# Patient Record
Sex: Female | Born: 1960
Health system: Southern US, Community
[De-identification: ages and names within clinical notes are randomized; demographics above are authoritative.]

## PROBLEM LIST (undated history)

## (undated) DIAGNOSIS — E663 Overweight: Secondary | ICD-10-CM

## (undated) DIAGNOSIS — R928 Other abnormal and inconclusive findings on diagnostic imaging of breast: Secondary | ICD-10-CM

## (undated) DIAGNOSIS — E782 Mixed hyperlipidemia: Secondary | ICD-10-CM

## (undated) DIAGNOSIS — D649 Anemia, unspecified: Secondary | ICD-10-CM

## (undated) DIAGNOSIS — E039 Hypothyroidism, unspecified: Secondary | ICD-10-CM

## (undated) DIAGNOSIS — T7840XA Allergy, unspecified, initial encounter: Secondary | ICD-10-CM

## (undated) DIAGNOSIS — L578 Other skin changes due to chronic exposure to nonionizing radiation: Secondary | ICD-10-CM

## (undated) DIAGNOSIS — B019 Varicella without complication: Secondary | ICD-10-CM

## (undated) DIAGNOSIS — R112 Nausea with vomiting, unspecified: Secondary | ICD-10-CM

## (undated) DIAGNOSIS — Z9889 Other specified postprocedural states: Secondary | ICD-10-CM

## (undated) DIAGNOSIS — E041 Nontoxic single thyroid nodule: Secondary | ICD-10-CM

## (undated) DIAGNOSIS — Z0000 Encounter for general adult medical examination without abnormal findings: Principal | ICD-10-CM

## (undated) HISTORY — PX: MOLE REMOVAL: SHX2046

## (undated) HISTORY — DX: Encounter for general adult medical examination without abnormal findings: Z00.00

## (undated) HISTORY — DX: Allergy, unspecified, initial encounter: T78.40XA

## (undated) HISTORY — PX: KNEE SURGERY: SHX244

## (undated) HISTORY — DX: Hypothyroidism, unspecified: E03.9

## (undated) HISTORY — DX: Nontoxic single thyroid nodule: E04.1

## (undated) HISTORY — DX: Other skin changes due to chronic exposure to nonionizing radiation: L57.8

## (undated) HISTORY — DX: Other abnormal and inconclusive findings on diagnostic imaging of breast: R92.8

## (undated) HISTORY — DX: Varicella without complication: B01.9

## (undated) HISTORY — DX: Anemia, unspecified: D64.9

## (undated) HISTORY — PX: BREAST SURGERY: SHX581

## (undated) HISTORY — DX: Mixed hyperlipidemia: E78.2

## (undated) HISTORY — DX: Overweight: E66.3

---

## 1997-04-19 ENCOUNTER — Other Ambulatory Visit: Admission: RE | Admit: 1997-04-19 | Discharge: 1997-04-19 | Payer: Self-pay | Admitting: Obstetrics and Gynecology

## 1997-04-27 ENCOUNTER — Ambulatory Visit (HOSPITAL_COMMUNITY): Admission: RE | Admit: 1997-04-27 | Discharge: 1997-04-27 | Payer: Self-pay | Admitting: Obstetrics and Gynecology

## 1998-05-09 ENCOUNTER — Other Ambulatory Visit: Admission: RE | Admit: 1998-05-09 | Discharge: 1998-05-09 | Payer: Self-pay | Admitting: Obstetrics and Gynecology

## 2000-01-22 ENCOUNTER — Other Ambulatory Visit: Admission: RE | Admit: 2000-01-22 | Discharge: 2000-01-22 | Payer: Self-pay | Admitting: Obstetrics and Gynecology

## 2002-06-18 ENCOUNTER — Encounter: Payer: Self-pay | Admitting: Endocrinology

## 2002-06-18 ENCOUNTER — Encounter: Admission: RE | Admit: 2002-06-18 | Discharge: 2002-06-18 | Payer: Self-pay | Admitting: Endocrinology

## 2004-02-29 ENCOUNTER — Other Ambulatory Visit: Admission: RE | Admit: 2004-02-29 | Discharge: 2004-02-29 | Payer: Self-pay | Admitting: Obstetrics and Gynecology

## 2007-01-02 HISTORY — PX: ENDOMETRIAL ABLATION: SHX621

## 2009-01-01 HISTORY — PX: BREAST BIOPSY: SHX20

## 2009-01-01 LAB — HM MAMMOGRAPHY

## 2009-08-05 ENCOUNTER — Encounter: Admission: RE | Admit: 2009-08-05 | Discharge: 2009-08-05 | Payer: Self-pay | Admitting: Obstetrics and Gynecology

## 2009-08-09 ENCOUNTER — Encounter: Admission: RE | Admit: 2009-08-09 | Discharge: 2009-08-09 | Payer: Self-pay | Admitting: Obstetrics and Gynecology

## 2010-04-19 ENCOUNTER — Other Ambulatory Visit: Payer: Self-pay | Admitting: Obstetrics and Gynecology

## 2010-04-19 DIAGNOSIS — Z09 Encounter for follow-up examination after completed treatment for conditions other than malignant neoplasm: Secondary | ICD-10-CM

## 2010-04-24 ENCOUNTER — Ambulatory Visit
Admission: RE | Admit: 2010-04-24 | Discharge: 2010-04-24 | Disposition: A | Discharge status: Home / Self Care | Payer: BC Managed Care – PPO | Source: Ambulatory Visit | Attending: Obstetrics and Gynecology | Admitting: Obstetrics and Gynecology

## 2010-04-24 DIAGNOSIS — Z09 Encounter for follow-up examination after completed treatment for conditions other than malignant neoplasm: Secondary | ICD-10-CM

## 2010-07-07 ENCOUNTER — Encounter (HOSPITAL_COMMUNITY): Payer: Self-pay

## 2010-07-07 ENCOUNTER — Encounter (HOSPITAL_COMMUNITY)
Admission: RE | Admit: 2010-07-07 | Discharge: 2010-07-07 | Disposition: A | Discharge status: Home / Self Care | Payer: BC Managed Care – PPO | Source: Ambulatory Visit | Attending: Obstetrics and Gynecology | Admitting: Obstetrics and Gynecology

## 2010-07-07 HISTORY — DX: Other specified postprocedural states: Z98.890

## 2010-07-07 HISTORY — DX: Nausea with vomiting, unspecified: R11.2

## 2010-07-07 HISTORY — DX: Hypothyroidism, unspecified: E03.9

## 2010-07-07 LAB — CBC
HCT: 42.4 % (ref 36.0–46.0)
MCHC: 33.7 g/dL (ref 30.0–36.0)
MCV: 96.6 fL (ref 78.0–100.0)
Platelets: 231 10*3/uL (ref 150–400)
RDW: 12.7 % (ref 11.5–15.5)
WBC: 5.7 10*3/uL (ref 4.0–10.5)

## 2010-07-07 MED ORDER — PROMETHAZINE HCL 25 MG/ML IJ SOLN: 6.2500 mg | INTRAMUSCULAR | Status: DC | PRN

## 2010-07-07 MED ORDER — ACETAMINOPHEN 10 MG/ML IV SOLN: 1000.0000 mg | Freq: Once | INTRAVENOUS | Status: DC | PRN

## 2010-07-07 MED ORDER — HYDROMORPHONE HCL 1 MG/ML IJ SOLN
0.2500 mg | INTRAMUSCULAR | Status: DC | PRN
Start: 2010-07-07 — End: 2010-07-07

## 2010-07-07 MED ORDER — KETOROLAC TROMETHAMINE 15 MG/ML IJ SOLN: 15.0000 mg | Freq: Once | INTRAMUSCULAR | Status: DC

## 2010-07-07 MED ORDER — MEPERIDINE HCL 25 MG/ML IJ SOLN: 6.2500 mg | INTRAMUSCULAR | Status: DC | PRN

## 2010-07-07 NOTE — Anesthesia Preprocedure Evaluation (Signed)
Anesthesia Evaluation  General Assessment Comment  Reviewed: Allergy & Precautions, H&P , Patient's Chart, lab work & pertinent test results and reviewed documented beta blocker date and time   History of Anesthesia Complications Negative for: history of anesthetic complications  Airway Mallampati: II TM Distance: >3 FB Neck ROM: full    Dental No notable dental hx    Pulmonaryneg pulmonary ROS      pulmonary exam normal hypothyroid  Cardiovascular Exercise Tolerance: Good regular Normal   Neuro/PsychNegative Neurological ROS Negative Psych ROS  GI/Hepatic/Renal negative GI ROS, negative Liver ROS, and negative Renal ROS (+)       Endo/Other  Negative Endocrine ROS (+)   Abdominal   Musculoskeletal  Hematology negative hematology ROS (+)   Peds  Reproductive/Obstetrics negative OB ROS          Anesthesia Physical Anesthesia Plan  ASA: II  Anesthesia Plan: General   Post-op Pain Management:    Induction:   Airway Management Planned:   Additional Equipment:   Intra-op Plan:   Post-operative Plan:   Informed Consent: I have reviewed the patients History and Physical, chart, labs and discussed the procedure including the risks, benefits and alternatives for the proposed anesthesia with the patient or authorized representative who has indicated his/her understanding and acceptance.   Dental Advisory Given  Plan Discussed with: CRNA and Surgeon  Anesthesia Plan Comments:         Anesthesia Quick Evaluation

## 2010-07-07 NOTE — Patient Instructions (Signed)
Written inst given to pt 

## 2010-07-11 ENCOUNTER — Encounter: Payer: Self-pay | Admitting: Obstetrics and Gynecology

## 2010-07-11 NOTE — H&P (Signed)
Jordan Juarez is an 50 y.o. female. She underwent EMB + Thermachoice EMA in 2009 for menorrhagia.  She improved initially, but over the last 6 12 mos. Has experienced continued problems with dysmenorrhea and menorrhagia.  She presents now for definitive LAVH.  This procedure, including risks of bleeding, transfusion, infection, adjacent organ injury, the possibility of having to complete the surgery via open technique, all reviewed, which she understands and accepts.  Pertinent Gynecological History: Menses: with severe dysmenorrhea Bleeding: menorrhagia Contraception: vasectomy DES exposure: denies Blood transfusions: none Sexually transmitted diseases: no past history Previous GYN Procedures: Thermachoice EMA  Last mammogram: normal Date: 08/2009 Last pap: normal Date: 06/2009 OB History: G3, P3   Menstrual History: Menarche age: 32 No LMP recorded.    Past Medical History  Diagnosis Date  . PONV (postoperative nausea and vomiting)   . Hypothyroidism     Past Surgical History  Procedure Date  . Endometrial ablation 2009    done in office- n&v postop    No family history on file.  Social History:  reports that she has never smoked. She does not have any smokeless tobacco history on file. She reports that she drinks about 1.2 ounces of alcohol per week. Her drug history not on file.  Allergies: No Known Allergies  No prescriptions prior to admission    Review of Systems  Constitutional: Negative.   HENT: Negative.   Eyes: Negative.   Respiratory: Negative.   Cardiovascular: Negative.   Gastrointestinal: Negative.   Genitourinary: Negative.   Musculoskeletal: Negative.   Skin: Negative.   Neurological: Negative.   Endo/Heme/Allergies: Negative.   Psychiatric/Behavioral: Negative.   All other systems reviewed and are negative.    There were no vitals taken for this visit. Physical Exam  Constitutional: She is oriented to person, place, and time. She appears  well-developed and well-nourished.  HENT:  Head: Normocephalic.  Eyes: Conjunctivae and EOM are normal. Pupils are equal, round, and reactive to light.  Neck: Normal range of motion. Neck supple.  Cardiovascular: Normal rate, regular rhythm and normal heart sounds.  Exam reveals no gallop and no friction rub.   No murmur heard. Respiratory: Effort normal and breath sounds normal. No respiratory distress. She has no wheezes. She has no rales.  GI: Soft. Bowel sounds are normal.  Genitourinary: Vagina normal.       Uterus upper limit NS, posterior  Musculoskeletal: Normal range of motion.  Neurological: She is alert and oriented to person, place, and time.    No results found for this or any previous visit (from the past 24 hour(s)).  No results found.  Assessment/Plan: 1. Menorrhagia 2.  Dysmenorrhea 3.  Prior endometrial ablation 4.  Possible Adenomyosis.   PLAN>>>LAVH , procedure + risks discussed as above.  Meriel Pica 07/11/2010, 2:53 PM

## 2010-07-11 NOTE — H&P (Signed)
Jordan Juarez is an 50 y.o. female. G3P3 with a history of prior Thermachoice EMA, now with continued heavy bleeding + dysmenorrhea, for LAVH. Procedure + specific risks related to bleeding, infection, adjacent organ injury, transfusion, the possible need for open or additional surgery, along with her expected recovery time, all of which she understands and accepts.  Pertinent Gynecological History: Mens Contraception: vasectomy DES exposure: denies Blood transfusions: none Sexually transmitted diseases: no past history Previous GYN Procedures: Thermachoice EMA  Last mammogram: normal Date: 08/2009 Last pap: normal Date: 2012 OB History: G3, P3   Menstrual History: Menarche age: 50 No LMP recorded.    Past Medical History  Diagnosis Date  . PONV (postoperative nausea and vomiting)   . Hypothyroidism     Past Surgical History  Procedure Date  . Endometrial ablation 2009    done in office- n&v postop    No family history on file.  Social History:  reports that she has never smoked. She does not have any smokeless tobacco history on file. She reports that she drinks about 1.2 ounces of alcohol per week. Her drug history not on file.  Allergies: No Known Allergies  No prescriptions prior to admission    Review of Systems  Constitutional: Negative.   HENT: Negative.   Eyes: Negative.   Respiratory: Negative.   Cardiovascular: Negative.   Gastrointestinal: Negative.   Genitourinary: Negative.   Musculoskeletal: Negative.   Skin: Negative.   Neurological: Negative.   Endo/Heme/Allergies: Negative.   Psychiatric/Behavioral: Negative.     There were no vitals taken for this visit. Physical Exam  Constitutional: She is oriented to person, place, and time. She appears well-developed and well-nourished.  HENT:  Head: Normocephalic.  Neck: Normal range of motion. Neck supple.  Cardiovascular: Normal rate, regular rhythm and normal heart sounds.   Respiratory: Effort  normal and breath sounds normal. No respiratory distress. She has no wheezes.  GI: Soft. Bowel sounds are normal.  Genitourinary: Vagina normal.       Uterus upper limit of normal size, retroflexed, adnexae NEG  Musculoskeletal: Normal range of motion.  Neurological: She is alert and oriented to person, place, and time.    No results found for this or any previous visit (from the past 24 hour(s)).  No results found.  Assessment/Plan: Menorrhagia + dysmenorrhea, failed EMA, probable adenomyosis>>>for LAVH.  [name] M 07/11/2010, 3:07 PM

## 2010-07-12 NOTE — H&P (Signed)
NAME:  Jordan Juarez, Jordan Juarez                  ACCOUNT NO.:  MEDICAL RECORD NO.:  [id number]  LOCATION:                                 FACILITY:  PHYSICIAN:  Duke Salvia. Marcelle Overlie, M.D.DATE OF BIRTH:  02/17/1960  DATE OF ADMISSION: DATE OF DISCHARGE:                             HISTORY & PHYSICAL   CHIEF COMPLAINT:  Pelvic pain, menorrhagia.  HISTORY OF PRESENT ILLNESS:  This is a 50 year old who has had a prior Thermachoice endometrial ablation and endometrial biopsy in April 2009. She had some significant improvement initially, but continued to have problematic menorrhagia and dysmenorrhea, presents now for definitive LAVH.  Last ultrasound in our office dated April 2009 showed a retroverted uterus, possible adenomyosis.  Adnexa was otherwise unremarkable.   Dictation ended at this point.     Amareon Phung M. Marcelle Overlie, M.D.     [facility]  D:  07/11/2010  T:  07/12/2010  Job:  [id number]

## 2010-07-13 ENCOUNTER — Encounter (HOSPITAL_COMMUNITY): Payer: Self-pay | Admitting: Anesthesiology

## 2010-07-13 ENCOUNTER — Encounter (HOSPITAL_COMMUNITY)
Admission: RE | Disposition: A | Discharge status: Home / Self Care | Payer: Self-pay | Source: Ambulatory Visit | Attending: Obstetrics and Gynecology

## 2010-07-13 ENCOUNTER — Ambulatory Visit (HOSPITAL_COMMUNITY)
Admission: RE | Admit: 2010-07-13 | Discharge: 2010-07-14 | Disposition: A | Discharge status: Home / Self Care | Payer: BC Managed Care – PPO | Source: Ambulatory Visit | Attending: Obstetrics and Gynecology | Admitting: Obstetrics and Gynecology

## 2010-07-13 ENCOUNTER — Other Ambulatory Visit: Payer: Self-pay | Admitting: Obstetrics and Gynecology

## 2010-07-13 ENCOUNTER — Ambulatory Visit (HOSPITAL_COMMUNITY): Payer: BC Managed Care – PPO | Admitting: Anesthesiology

## 2010-07-13 ENCOUNTER — Encounter (HOSPITAL_COMMUNITY): Payer: Self-pay | Admitting: *Deleted

## 2010-07-13 DIAGNOSIS — N920 Excessive and frequent menstruation with regular cycle: Secondary | ICD-10-CM | POA: Insufficient documentation

## 2010-07-13 DIAGNOSIS — N946 Dysmenorrhea, unspecified: Secondary | ICD-10-CM | POA: Insufficient documentation

## 2010-07-13 DIAGNOSIS — D259 Leiomyoma of uterus, unspecified: Secondary | ICD-10-CM | POA: Insufficient documentation

## 2010-07-13 DIAGNOSIS — Z01812 Encounter for preprocedural laboratory examination: Secondary | ICD-10-CM | POA: Insufficient documentation

## 2010-07-13 DIAGNOSIS — Z01818 Encounter for other preprocedural examination: Secondary | ICD-10-CM | POA: Insufficient documentation

## 2010-07-13 HISTORY — PX: ABDOMINAL HYSTERECTOMY: SHX81

## 2010-07-13 HISTORY — PX: LAPAROSCOPIC ASSISTED VAGINAL HYSTERECTOMY: SHX5398

## 2010-07-13 HISTORY — PX: TOTAL ABDOMINAL HYSTERECTOMY: SHX209

## 2010-07-13 LAB — PREGNANCY, URINE: Preg Test, Ur: NEGATIVE

## 2010-07-13 SURGERY — HYSTERECTOMY, VAGINAL, LAPAROSCOPY-ASSISTED
Anesthesia: General

## 2010-07-13 MED ORDER — FENTANYL CITRATE 0.05 MG/ML IJ SOLN
INTRAMUSCULAR | Status: AC
  Filled 2010-07-13: qty 5

## 2010-07-13 MED ORDER — ONDANSETRON HCL 4 MG/2ML IJ SOLN: 4.0000 mg | Freq: Four times a day (QID) | INTRAMUSCULAR | Status: DC | PRN

## 2010-07-13 MED ORDER — ONDANSETRON HCL 4 MG/2ML IJ SOLN
INTRAMUSCULAR | Status: AC
  Filled 2010-07-13: qty 2

## 2010-07-13 MED ORDER — SODIUM CHLORIDE 0.9 % IR SOLN
Status: DC | PRN
  Administered 2010-07-13: 1000 mL

## 2010-07-13 MED ORDER — LACTATED RINGERS IR SOLN
Status: DC | PRN
  Administered 2010-07-13: 3000 mL

## 2010-07-13 MED ORDER — DEXTROSE IN RINGERS 5 % IV SOLN
INTRAVENOUS | Status: DC
  Administered 2010-07-13 – 2010-07-14 (×2): via INTRAVENOUS
  Filled 2010-07-13 (×4): qty 1000

## 2010-07-13 MED ORDER — KETOROLAC TROMETHAMINE 30 MG/ML IJ SOLN
INTRAMUSCULAR | Status: DC | PRN
  Administered 2010-07-13: 30 mg via INTRAVENOUS

## 2010-07-13 MED ORDER — MIDAZOLAM HCL 2 MG/2ML IJ SOLN
INTRAMUSCULAR | Status: AC
  Filled 2010-07-13: qty 2

## 2010-07-13 MED ORDER — KETOROLAC TROMETHAMINE 30 MG/ML IJ SOLN: 30.0000 mg | Freq: Once | INTRAMUSCULAR | Status: DC

## 2010-07-13 MED ORDER — ROCURONIUM BROMIDE 50 MG/5ML IV SOLN
INTRAVENOUS | Status: AC
  Filled 2010-07-13: qty 1

## 2010-07-13 MED ORDER — NEOSTIGMINE METHYLSULFATE 1 MG/ML IJ SOLN
INTRAMUSCULAR | Status: AC
  Filled 2010-07-13: qty 10

## 2010-07-13 MED ORDER — IBUPROFEN 800 MG PO TABS: 800.0000 mg | ORAL_TABLET | Freq: Three times a day (TID) | ORAL | Status: DC | PRN

## 2010-07-13 MED ORDER — ONDANSETRON HCL 4 MG/2ML IJ SOLN
INTRAMUSCULAR | Status: DC | PRN
  Administered 2010-07-13: 4 mg via INTRAVENOUS

## 2010-07-13 MED ORDER — DEXAMETHASONE SODIUM PHOSPHATE 10 MG/ML IJ SOLN
INTRAMUSCULAR | Status: DC | PRN
  Administered 2010-07-13: 10 mg via INTRAVENOUS

## 2010-07-13 MED ORDER — KETOROLAC TROMETHAMINE 30 MG/ML IJ SOLN: 30.0000 mg | Freq: Four times a day (QID) | INTRAMUSCULAR | Status: DC

## 2010-07-13 MED ORDER — LACTATED RINGERS IV SOLN
500.0000 mL | INTRAVENOUS | Status: DC
  Administered 2010-07-13: 1000 mL via INTRAVENOUS
  Administered 2010-07-13 (×2): 500 mL via INTRAVENOUS

## 2010-07-13 MED ORDER — NALOXONE HCL 0.4 MG/ML IJ SOLN: 0.4000 mg | INTRAMUSCULAR | Status: DC | PRN

## 2010-07-13 MED ORDER — SCOPOLAMINE 1 MG/3DAYS TD PT72
MEDICATED_PATCH | TRANSDERMAL | Status: AC
  Administered 2010-07-13: 1.5 mg
  Filled 2010-07-13: qty 1

## 2010-07-13 MED ORDER — MORPHINE SULFATE 10 MG/ML IJ SOLN
INTRAMUSCULAR | Status: AC
  Filled 2010-07-13: qty 1

## 2010-07-13 MED ORDER — MENTHOL 3 MG MT LOZG
1.0000 | LOZENGE | OROMUCOSAL | Status: DC | PRN
  Filled 2010-07-13: qty 9

## 2010-07-13 MED ORDER — GLYCOPYRROLATE 0.2 MG/ML IJ SOLN
INTRAMUSCULAR | Status: DC | PRN
  Administered 2010-07-13: .4 mg via INTRAVENOUS
  Administered 2010-07-13: .2 mg via INTRAVENOUS

## 2010-07-13 MED ORDER — ROCURONIUM BROMIDE 100 MG/10ML IV SOLN
INTRAVENOUS | Status: DC | PRN
  Administered 2010-07-13: 50 mg via INTRAVENOUS

## 2010-07-13 MED ORDER — CEFAZOLIN SODIUM 1-5 GM-% IV SOLN
INTRAVENOUS | Status: DC | PRN
  Administered 2010-07-13: 1 g via INTRAVENOUS

## 2010-07-13 MED ORDER — DIPHENHYDRAMINE HCL 50 MG/ML IJ SOLN: 12.5000 mg | Freq: Four times a day (QID) | INTRAMUSCULAR | Status: DC | PRN

## 2010-07-13 MED ORDER — DIPHENHYDRAMINE HCL 50 MG/ML IJ SOLN
INTRAMUSCULAR | Status: AC
  Filled 2010-07-13: qty 1

## 2010-07-13 MED ORDER — DEXAMETHASONE SODIUM PHOSPHATE 10 MG/ML IJ SOLN
INTRAMUSCULAR | Status: AC
  Filled 2010-07-13: qty 1

## 2010-07-13 MED ORDER — CEFAZOLIN SODIUM 1-5 GM-% IV SOLN
INTRAVENOUS | Status: AC
  Filled 2010-07-13: qty 50

## 2010-07-13 MED ORDER — OXYCODONE-ACETAMINOPHEN 5-325 MG PO TABS: 1.0000 | ORAL_TABLET | ORAL | Status: DC | PRN

## 2010-07-13 MED ORDER — LIDOCAINE HCL (PF) 2 % IJ SOLN
INTRAMUSCULAR | Status: DC | PRN
  Administered 2010-07-13: 100 mg

## 2010-07-13 MED ORDER — PROPOFOL 10 MG/ML IV EMUL
INTRAVENOUS | Status: AC
  Filled 2010-07-13: qty 20

## 2010-07-13 MED ORDER — FENTANYL CITRATE 0.05 MG/ML IJ SOLN
INTRAMUSCULAR | Status: DC | PRN
  Administered 2010-07-13: 50 ug via INTRAVENOUS
  Administered 2010-07-13: 200 ug via INTRAVENOUS

## 2010-07-13 MED ORDER — LACTATED RINGERS IV SOLN
INTRAVENOUS | Status: DC | PRN
  Administered 2010-07-13 (×2): via INTRAVENOUS

## 2010-07-13 MED ORDER — SIMETHICONE 80 MG PO CHEW: 80.0000 mg | CHEWABLE_TABLET | Freq: Four times a day (QID) | ORAL | Status: DC | PRN

## 2010-07-13 MED ORDER — PROPOFOL 10 MG/ML IV EMUL
INTRAVENOUS | Status: DC | PRN
  Administered 2010-07-13: 200 mg via INTRAVENOUS

## 2010-07-13 MED ORDER — MORPHINE SULFATE 10 MG/ML IJ SOLN
INTRAMUSCULAR | Status: DC | PRN
  Administered 2010-07-13: 10 mg via INTRAVENOUS

## 2010-07-13 MED ORDER — ZOLPIDEM TARTRATE 5 MG PO TABS: 5.0000 mg | ORAL_TABLET | Freq: Every evening | ORAL | Status: DC | PRN

## 2010-07-13 MED ORDER — MIDAZOLAM HCL 5 MG/5ML IJ SOLN
INTRAMUSCULAR | Status: DC | PRN
  Administered 2010-07-13: 2 mg via INTRAVENOUS

## 2010-07-13 MED ORDER — NEOSTIGMINE METHYLSULFATE 1 MG/ML IJ SOLN
INTRAMUSCULAR | Status: DC | PRN
  Administered 2010-07-13: 2 mg via INTRAMUSCULAR

## 2010-07-13 MED ORDER — ONDANSETRON HCL 4 MG PO TABS: 4.0000 mg | ORAL_TABLET | Freq: Four times a day (QID) | ORAL | Status: DC | PRN

## 2010-07-13 MED ORDER — SODIUM CHLORIDE 0.9 % IJ SOLN: 9.0000 mL | INTRAMUSCULAR | Status: DC | PRN

## 2010-07-13 MED ORDER — DIPHENHYDRAMINE HCL 12.5 MG/5ML PO ELIX
12.5000 mg | ORAL_SOLUTION | Freq: Four times a day (QID) | ORAL | Status: DC | PRN
  Filled 2010-07-13: qty 5

## 2010-07-13 MED ORDER — KETOROLAC TROMETHAMINE 30 MG/ML IJ SOLN
30.0000 mg | Freq: Four times a day (QID) | INTRAMUSCULAR | Status: DC
  Administered 2010-07-13 – 2010-07-14 (×3): 30 mg via INTRAVENOUS
  Filled 2010-07-13 (×3): qty 1

## 2010-07-13 MED ORDER — SODIUM CHLORIDE 0.9 % IJ SOLN: 3.0000 mL | Freq: Two times a day (BID) | INTRAMUSCULAR | Status: DC

## 2010-07-13 MED ORDER — BUPIVACAINE HCL (PF) 0.25 % IJ SOLN
INTRAMUSCULAR | Status: DC | PRN
  Administered 2010-07-13: 10 mL

## 2010-07-13 MED ORDER — METOCLOPRAMIDE HCL 5 MG/ML IJ SOLN
10.0000 mg | Freq: Once | INTRAMUSCULAR | Status: AC
  Administered 2010-07-13: 10 mg via INTRAVENOUS

## 2010-07-13 MED ORDER — SODIUM CHLORIDE 0.9 % IV SOLN: 250.0000 mL | INTRAVENOUS | Status: DC

## 2010-07-13 MED ORDER — BISACODYL 10 MG RE SUPP
10.0000 mg | Freq: Every day | RECTAL | Status: DC | PRN
  Filled 2010-07-13: qty 1

## 2010-07-13 MED ORDER — SODIUM CHLORIDE 0.9 % IJ SOLN: 3.0000 mL | INTRAMUSCULAR | Status: DC | PRN

## 2010-07-13 MED ORDER — DIPHENHYDRAMINE HCL 50 MG/ML IJ SOLN
INTRAMUSCULAR | Status: DC | PRN
  Administered 2010-07-13: 12.5 mg via INTRAVENOUS

## 2010-07-13 MED ORDER — FENTANYL CITRATE 0.05 MG/ML IJ SOLN: 25.0000 ug | INTRAMUSCULAR | Status: DC | PRN

## 2010-07-13 MED ORDER — LIDOCAINE HCL (CARDIAC) 20 MG/ML IV SOLN
INTRAVENOUS | Status: AC
  Filled 2010-07-13: qty 5

## 2010-07-13 MED ORDER — GLYCOPYRROLATE 0.2 MG/ML IJ SOLN
INTRAMUSCULAR | Status: AC
  Filled 2010-07-13: qty 1

## 2010-07-13 MED ORDER — MORPHINE SULFATE (PF) 1 MG/ML IV SOLN
INTRAVENOUS | Status: DC
Start: 2010-07-13 — End: 2010-07-14
  Administered 2010-07-13: 25 mg via INTRAVENOUS
  Administered 2010-07-13: 7 mg via INTRAVENOUS
  Filled 2010-07-13: qty 25

## 2010-07-13 SURGICAL SUPPLY — 35 items
CABLE HIGH FREQUENCY MONO STRZ (ELECTRODE) IMPLANT
CATH ROBINSON RED A/P 16FR (CATHETERS) ×2 IMPLANT
CLOTH BEACON ORANGE TIMEOUT ST (SAFETY) ×2 IMPLANT
CONT PATH 16OZ SNAP LID 3702 (MISCELLANEOUS) ×2 IMPLANT
COVER TABLE BACK 60X90 (DRAPES) ×2 IMPLANT
DECANTER SPIKE VIAL GLASS SM (MISCELLANEOUS) ×2 IMPLANT
DERMABOND ADVANCED (GAUZE/BANDAGES/DRESSINGS) ×2 IMPLANT
DRAPE UTILITY XL STRL (DRAPES) ×2 IMPLANT
ELECT LIGASURE LONG (ELECTRODE) ×2 IMPLANT
ELECT REM PT RETURN 9FT ADLT (ELECTROSURGICAL) ×2
ELECTRODE REM PT RTRN 9FT ADLT (ELECTROSURGICAL) ×1 IMPLANT
GAUZE SPONGE 4X4 16PLY XRAY LF (GAUZE/BANDAGES/DRESSINGS) ×2 IMPLANT
GLOVE BIO SURGEON STRL SZ7 (GLOVE) ×6 IMPLANT
GLOVE BIOGEL PI IND STRL 6.5 (GLOVE) ×1 IMPLANT
GLOVE BIOGEL PI INDICATOR 6.5 (GLOVE) ×1
GOWN BRE IMP SLV AUR LG STRL (GOWN DISPOSABLE) ×8 IMPLANT
NEEDLE INSUFFLATION 14GA 120MM (NEEDLE) ×2 IMPLANT
NS IRRIG 1000ML POUR BTL (IV SOLUTION) ×2 IMPLANT
PACK LAVH (CUSTOM PROCEDURE TRAY) ×2 IMPLANT
SEALER TISSUE G2 CVD JAW 45CM (ENDOMECHANICALS) ×2 IMPLANT
SET IRRIG TUBING LAPAROSCOPIC (IRRIGATION / IRRIGATOR) ×2 IMPLANT
SOLUTION ELECTROLUBE (MISCELLANEOUS) IMPLANT
STRIP CLOSURE SKIN 1/4X3 (GAUZE/BANDAGES/DRESSINGS) IMPLANT
SUT MON AB 2-0 CT1 36 (SUTURE) ×4 IMPLANT
SUT VIC AB 0 CT1 18XCR BRD8 (SUTURE) ×2 IMPLANT
SUT VIC AB 0 CT1 8-18 (SUTURE) ×2
SUT VICRYL 0 TIES 12 18 (SUTURE) ×2 IMPLANT
SUT VICRYL 0 UR6 27IN ABS (SUTURE) ×2 IMPLANT
SUT VICRYL 4-0 PS2 18IN ABS (SUTURE) ×2 IMPLANT
TOWEL OR 17X24 6PK STRL BLUE (TOWEL DISPOSABLE) ×4 IMPLANT
TRAY FOLEY CATH 14FR (SET/KITS/TRAYS/PACK) ×2 IMPLANT
TROCAR Z-THREAD BLADED 11X100M (TROCAR) ×2 IMPLANT
TROCAR Z-THREAD BLADED 5X100MM (TROCAR) ×2 IMPLANT
WARMER LAPAROSCOPE (MISCELLANEOUS) ×2 IMPLANT
WATER STERILE IRR 1000ML POUR (IV SOLUTION) ×2 IMPLANT

## 2010-07-13 NOTE — Anesthesia Postprocedure Evaluation (Signed)
  Anesthesia Post-op Note  Patient: Jordan Juarez  Procedure(s) Performed:  LAPAROSCOPIC ASSISTED VAGINAL HYSTERECTOMY  Patient Location: PACU  Anesthesia Type: General  Level of Consciousness: awake, alert , oriented and sedated  Airway and Oxygen Therapy: Patient Spontanous Breathing  Post-op Pain: mild  Post-op Assessment: Post-op Vital signs reviewed, Patient's Cardiovascular Status Stable, Respiratory Function Stable, Patent Airway and Pain level controlled  Post-op Vital Signs: Reviewed and stable  Complications: No apparent anesthesia complications

## 2010-07-13 NOTE — Brief Op Note (Signed)
07/13/2010  8:50 AM  PATIENT:  Jordan Juarez  50 y.o. female  PRE-OPERATIVE DIAGNOSIS:  Menorrhagia,dysmenorrhea,probable adenomyosis  POST-OPERATIVE DIAGNOSIS:  same  PROCEDURE:  Procedure(s): LAPAROSCOPIC ASSISTED VAGINAL HYSTERECTOMY  SURGEON:  Surgeon(s): Doug Bucklin M Haddie Bruhl W Ronald Neal  PHYSICIAN ASSISTANT:   ASSISTANTS: Neal   ANESTHESIA:   general  ESTIMATED BLOOD LOSS: * No blood loss amount entered *   BLOOD ADMINISTERED:none  DRAINS: Urinary Catheter (Foley)   LOCAL MEDICATIONS USED:  MARCAINE 10CC  SPECIMEN:  Source of Specimen:  uterus  DISPOSITION OF SPECIMEN:  PATHOLOGY  COUNTS:  YES  TOURNIQUET:  * No tourniquets in log *  DICTATION #: see below  PLAN OF CARE: to PACU  PATIENT DISPOSITION:  PACU - hemodynamically stable.   Delay start of Pharmacological VTE agent (>24hrs) due to surgical blood loss or risk of bleeding:  no        Preop diagnosis: Menorrhagia, dysmenorrhea, probable adenomyosis, failed ThermaChoice endometrial ablation. Postop diagnosis: Same Procedure LAVH Surgeon [facility]  assistant Dr. Jennette Kettle  Specimens removed: Uterus, to pathology EBL: 200 cc Complications: None Procedure and findings:  The patient was taken to the operating room, after an adequate level of general endotracheal anesthesia was obtained with the legs in stirrups, the abdomen perineum and vagina were prepped and draped in the usual fashion for LAVH. The bladder was drained, UA carried out the uterus is upper limit of normal size, posterior in position adnexa negative. Holter tenaculum was positioned for a posterior uterus.  Attention directed to the abdomen where the subumbilical area was infiltrated with quarter percent Marcaine plain, a small incision was made the very seal was introduced without difficulty. Its intra-abdominal position was verified by pressure and water testing. After a 3 L pneumoperitoneum with syncope, laparoscopic trocar and sleeve  were introduced without difficulty. 3 finger breaths above the symphysis in the midline a 5 mm trocar was inserted under direct visualization. The patient was then placed in Trendelenburg, pelvic findings as follows:  The uterus itself was extremely retroflexed, symmetric 7 week size, adnexa negative. The cul-de-sac was free and clear. Upper abdominal findings were otherwise unremarkable.  Using the in seal device, starting on the right the utero-ovarian pedicle was coagulated and divided down to and including the round ligament with excellent hemostasis. The exact same was repeated on the opposite side, conserving both normal-appearing ovaries.  The vaginal portion of the procedure started at this point. The legs were extended, weighted speculum was positioned, cervical vaginal mucosa was incised circumferentially, posterior cul-de-sac was entered without difficulty. The bladder was advanced superiorly with sharp and blunt dissection. Using the LigaSure device the uterosacral ligament, uterine vasculature pedicles were coagulated and divided. The bladder was advanced superiorly until the anterior peritoneal reflection could be identified, this was entered sharply and a retractor used to gently elevate the bladder out of the field. The remaining upper broad ligament pedicles were coagulated and divided. The fundus of the uterus was then delivered posteriorly, remaining pedicles were clamped and divided and the specimen was removed. The vaginal cuff was then closed with 3 to 9:00 with a running locked 2-0 Monocryl suture. Prior to closure sponge needle and instrument counts were reported as correct x2. The vaginal mucosa was then closed right to left with interrupted 2-0 Monocryl sutures. Foley catheter was positioned draining clear urine.  Repeat laparoscopy with nasi irrigation revealed excellent hemostasis even at reduced pressure. Instruments were removed gas allowed to escape, the upper defect closed  with a  4-0 Monocryl subcuticular suture with Dermabond on the lower incision. She tolerated this well and went to recovery in good condition.

## 2010-07-13 NOTE — Progress Notes (Signed)
  Alert + conversant, adeq. UOP, minimal pai.  Pt prefers to have cath out this PM.

## 2010-07-13 NOTE — Transfer of Care (Signed)
Immediate Anesthesia Transfer of Care Note  Patient: Jordan Juarez  Procedure(s) Performed:  LAPAROSCOPIC ASSISTED VAGINAL HYSTERECTOMY  Patient Location: PACU  Anesthesia Type: General  Level of Consciousness: awake, alert  and oriented  Airway & Oxygen Therapy: Patient Spontanous Breathing and Patient connected to nasal cannula oxygen  Post-op Assessment: Report given to PACU RN and Post -op Vital signs reviewed and stable  Post vital signs: Reviewed and stable  Complications: No apparent anesthesia complications

## 2010-07-13 NOTE — Preoperative (Signed)
Beta Blockers   Reason not to administer Beta Blockers:Not Applicable 

## 2010-07-13 NOTE — Addendum Note (Signed)
Addendum  created 07/13/10 1000 by Tyrone Apple. [employer] edited:PRL Based Order Sets

## 2010-07-13 NOTE — Op Note (Signed)
Dictated under brief op note/[facility]

## 2010-07-14 LAB — CBC
HCT: 31.6 % — ABNORMAL LOW (ref 36.0–46.0)
Hemoglobin: 10.6 g/dL — ABNORMAL LOW (ref 12.0–15.0)
MCH: 32.4 pg (ref 26.0–34.0)
MCV: 96.6 fL (ref 78.0–100.0)
RBC: 3.27 MIL/uL — ABNORMAL LOW (ref 3.87–5.11)

## 2010-07-14 MED ORDER — OXYCODONE-ACETAMINOPHEN 5-325 MG PO TABS: 1.0000 | ORAL_TABLET | ORAL | Status: AC | PRN

## 2010-07-14 MED ORDER — IBUPROFEN 800 MG PO TABS: 800.0000 mg | ORAL_TABLET | Freq: Three times a day (TID) | ORAL | Status: AC | PRN

## 2010-07-14 MED ORDER — DEXTROSE IN LACTATED RINGERS 5 % IV SOLN: INTRAVENOUS | Status: DC

## 2010-07-14 NOTE — Discharge Summary (Signed)
  Date of admission 712 Date of discharge 713 Discharge diagnoses:   1. Menorrhagia, dysmenorrhea   2. LAVH this admission.     Hospital course:  This patient underwent LAVH on 712 under general general anesthesia. Both ovaries were normal and were conserved at that time. Her EBL was 200 cc.  On the evening of surgery her catheter was removed, she was ambulating and voiding without difficulty. The following a.m. had established established normal urinary function, was afebrile, her abdominal exam was unremarkable, tolerating a regular diet and ready for discharge at that point.  Laboratory data: Preop hemoglobin was 14, postop hemoglobin 10.6.  Disposition:  Patient is discharged on Percocet 5/325, #30, one or 2 by mouth every 6 hours when necessary pain or Motrin 800 mg, #30, one by mouth Q8 to 12 hours. Pain. She will return the office in 7-10 days. Advised to report any increased bleeding, fever over 101, dysuria, persistent nausea or vomiting. She was given specific instructions regarding diet, sex, exercise.

## 2010-08-01 ENCOUNTER — Encounter (HOSPITAL_COMMUNITY): Payer: Self-pay | Admitting: Obstetrics and Gynecology

## 2010-08-03 ENCOUNTER — Other Ambulatory Visit: Payer: Self-pay | Admitting: Obstetrics and Gynecology

## 2012-06-03 ENCOUNTER — Ambulatory Visit (INDEPENDENT_AMBULATORY_CARE_PROVIDER_SITE_OTHER): Payer: 59 | Admitting: Family Medicine

## 2012-06-03 ENCOUNTER — Telehealth: Payer: Self-pay | Admitting: Family Medicine

## 2012-06-03 ENCOUNTER — Encounter: Payer: Self-pay | Admitting: Family Medicine

## 2012-06-03 VITALS — BP 100/68 | HR 69 | Temp 98.3°F | Ht 63.5 in | Wt 162.1 lb

## 2012-06-03 DIAGNOSIS — Z0000 Encounter for general adult medical examination without abnormal findings: Secondary | ICD-10-CM

## 2012-06-03 DIAGNOSIS — R928 Other abnormal and inconclusive findings on diagnostic imaging of breast: Secondary | ICD-10-CM

## 2012-06-03 DIAGNOSIS — E041 Nontoxic single thyroid nodule: Secondary | ICD-10-CM

## 2012-06-03 DIAGNOSIS — T7840XA Allergy, unspecified, initial encounter: Secondary | ICD-10-CM

## 2012-06-03 DIAGNOSIS — D649 Anemia, unspecified: Secondary | ICD-10-CM

## 2012-06-03 DIAGNOSIS — L578 Other skin changes due to chronic exposure to nonionizing radiation: Secondary | ICD-10-CM

## 2012-06-03 DIAGNOSIS — E039 Hypothyroidism, unspecified: Secondary | ICD-10-CM

## 2012-06-03 HISTORY — DX: Nontoxic single thyroid nodule: E04.1

## 2012-06-03 HISTORY — DX: Anemia, unspecified: D64.9

## 2012-06-03 HISTORY — DX: Other skin changes due to chronic exposure to nonionizing radiation: L57.8

## 2012-06-03 HISTORY — DX: Hypothyroidism, unspecified: E03.9

## 2012-06-03 HISTORY — DX: Other abnormal and inconclusive findings on diagnostic imaging of breast: R92.8

## 2012-06-03 HISTORY — DX: Encounter for general adult medical examination without abnormal findings: Z00.00

## 2012-06-03 HISTORY — DX: Allergy, unspecified, initial encounter: T78.40XA

## 2012-06-03 NOTE — Telephone Encounter (Signed)
Labs in 4 weeks fasting, lipid, renal, cbc, tsh, hepatic   Patient will be going to the [facility] lab

## 2012-06-03 NOTE — Telephone Encounter (Signed)
Orders placed.

## 2012-06-03 NOTE — Patient Instructions (Addendum)
Labs in 4 weeks fasting, lipid, renal, cbc, tsh, hepatic  Preventive Care for Adults, Female A healthy lifestyle and preventive care can promote health and wellness. Preventive health guidelines for women include the following key practices.  A routine yearly physical is a good way to check with your caregiver about your health and preventive screening. It is a chance to share any concerns and updates on your health, and to receive a thorough exam.  Visit your dentist for a routine exam and preventive care every 6 months. Brush your teeth twice a day and floss once a day. Good oral hygiene prevents tooth decay and gum disease.  The frequency of eye exams is based on your age, health, family medical history, use of contact lenses, and other factors. Follow your caregiver's recommendations for frequency of eye exams.  Eat a healthy diet. Foods like vegetables, fruits, whole grains, low-fat dairy products, and lean protein foods contain the nutrients you need without too many calories. Decrease your intake of foods high in solid fats, added sugars, and salt. Eat the right amount of calories for you.Get information about a proper diet from your caregiver, if necessary.  Regular physical exercise is one of the most important things you can do for your health. Most adults should get at least 150 minutes of moderate-intensity exercise (any activity that increases your heart rate and causes you to sweat) each week. In addition, most adults need muscle-strengthening exercises on 2 or more days a week.  Maintain a healthy weight. The body mass index (BMI) is a screening tool to identify possible weight problems. It provides an estimate of body fat based on height and weight. Your caregiver can help determine your BMI, and can help you achieve or maintain a healthy weight.For adults 20 years and older:  A BMI below 18.5 is considered underweight.  A BMI of 18.5 to 24.9 is normal.  A BMI of 25 to 29.9 is  considered overweight.  A BMI of 30 and above is considered obese.  Maintain normal blood lipids and cholesterol levels by exercising and minimizing your intake of saturated fat. Eat a balanced diet with plenty of fruit and vegetables. Blood tests for lipids and cholesterol should begin at age 41 and be repeated every 5 years. If your lipid or cholesterol levels are high, you are over 50, or you are at high risk for heart disease, you may need your cholesterol levels checked more frequently.Ongoing high lipid and cholesterol levels should be treated with medicines if diet and exercise are not effective.  If you smoke, find out from your caregiver how to quit. If you do not use tobacco, do not start.  If you are pregnant, do not drink alcohol. If you are breastfeeding, be very cautious about drinking alcohol. If you are not pregnant and choose to drink alcohol, do not exceed 1 drink per day. One drink is considered to be 12 ounces (355 mL) of beer, 5 ounces (148 mL) of wine, or 1.5 ounces (44 mL) of liquor.  Avoid use of street drugs. Do not share needles with anyone. Ask for help if you need support or instructions about stopping the use of drugs.  High blood pressure causes heart disease and increases the risk of stroke. Your blood pressure should be checked at least every 1 to 2 years. Ongoing high blood pressure should be treated with medicines if weight loss and exercise are not effective.  If you are 53 to 52 years old, ask your  caregiver if you should take aspirin to prevent strokes.  Diabetes screening involves taking a blood sample to check your fasting blood sugar level. This should be done once every 3 years, after age 11, if you are within normal weight and without risk factors for diabetes. Testing should be considered at a younger age or be carried out more frequently if you are overweight and have at least 1 risk factor for diabetes.  Breast cancer screening is essential preventive  care for women. You should practice "breast self-awareness." This means understanding the normal appearance and feel of your breasts and may include breast self-examination. Any changes detected, no matter how small, should be reported to a caregiver. Women in their 71s and 30s should have a clinical breast exam (CBE) by a caregiver as part of a regular health exam every 1 to 3 years. After age 40, women should have a CBE every year. Starting at age 56, women should consider having a mammography (breast X-ray test) every year. Women who have a family history of breast cancer should talk to their caregiver about genetic screening. Women at a high risk of breast cancer should talk to their caregivers about having magnetic resonance imaging (MRI) and a mammography every year.  The Pap test is a screening test for cervical cancer. A Pap test can show cell changes on the cervix that might become cervical cancer if left untreated. A Pap test is a procedure in which cells are obtained and examined from the lower end of the uterus (cervix).  Women should have a Pap test starting at age 82.  Between ages 87 and 27, Pap tests should be repeated every 2 years.  Beginning at age 22, you should have a Pap test every 3 years as long as the past 3 Pap tests have been normal.  Some women have medical problems that increase the chance of getting cervical cancer. Talk to your caregiver about these problems. It is especially important to talk to your caregiver if a new problem develops soon after your last Pap test. In these cases, your caregiver may recommend more frequent screening and Pap tests.  The above recommendations are the same for women who have or have not gotten the vaccine for human papillomavirus (HPV).  If you had a hysterectomy for a problem that was not cancer or a condition that could lead to cancer, then you no longer need Pap tests. Even if you no longer need a Pap test, a regular exam is a good idea  to make sure no other problems are starting.  If you are between ages 65 and 34, and you have had normal Pap tests going back 10 years, you no longer need Pap tests. Even if you no longer need a Pap test, a regular exam is a good idea to make sure no other problems are starting.  If you have had past treatment for cervical cancer or a condition that could lead to cancer, you need Pap tests and screening for cancer for at least 20 years after your treatment.  If Pap tests have been discontinued, risk factors (such as a new sexual partner) need to be reassessed to determine if screening should be resumed.  The HPV test is an additional test that may be used for cervical cancer screening. The HPV test looks for the virus that can cause the cell changes on the cervix. The cells collected during the Pap test can be tested for HPV. The HPV test could be  used to screen women aged 24 years and older, and should be used in women of any age who have unclear Pap test results. After the age of 60, women should have HPV testing at the same frequency as a Pap test.  Colorectal cancer can be detected and often prevented. Most routine colorectal cancer screening begins at the age of 4 and continues through age 55. However, your caregiver may recommend screening at an earlier age if you have risk factors for colon cancer. On a yearly basis, your caregiver may provide home test kits to check for hidden blood in the stool. Use of a small camera at the end of a tube, to directly examine the colon (sigmoidoscopy or colonoscopy), can detect the earliest forms of colorectal cancer. Talk to your caregiver about this at age 40, when routine screening begins. Direct examination of the colon should be repeated every 5 to 10 years through age 33, unless early forms of pre-cancerous polyps or small growths are found.  Hepatitis C blood testing is recommended for all people born from [birth year] through 1965 and any individual with known  risks for hepatitis C.  Practice safe sex. Use condoms and avoid high-risk sexual practices to reduce the spread of sexually transmitted infections (STIs). STIs include gonorrhea, chlamydia, syphilis, trichomonas, herpes, HPV, and human immunodeficiency virus (HIV). Herpes, HIV, and HPV are viral illnesses that have no cure. They can result in disability, cancer, and death. Sexually active women aged 43 and younger should be checked for chlamydia. Older women with new or multiple partners should also be tested for chlamydia. Testing for other STIs is recommended if you are sexually active and at increased risk.  Osteoporosis is a disease in which the bones lose minerals and strength with aging. This can result in serious bone fractures. The risk of osteoporosis can be identified using a bone density scan. Women ages 36 and over and women at risk for fractures or osteoporosis should discuss screening with their caregivers. Ask your caregiver whether you should take a calcium supplement or vitamin D to reduce the rate of osteoporosis.  Menopause can be associated with physical symptoms and risks. Hormone replacement therapy is available to decrease symptoms and risks. You should talk to your caregiver about whether hormone replacement therapy is right for you.  Use sunscreen with sun protection factor (SPF) of 30 or more. Apply sunscreen liberally and repeatedly throughout the day. You should seek shade when your shadow is shorter than you. Protect yourself by wearing long sleeves, pants, a wide-brimmed hat, and sunglasses year round, whenever you are outdoors.  Once a month, do a whole body skin exam, using a mirror to look at the skin on your back. Notify your caregiver of new moles, moles that have irregular borders, moles that are larger than a pencil eraser, or moles that have changed in shape or color.  Stay current with required immunizations.  Influenza. You need a dose every fall (or winter).  The composition of the flu vaccine changes each year, so being vaccinated once is not enough.  Pneumococcal polysaccharide. You need 1 to 2 doses if you smoke cigarettes or if you have certain chronic medical conditions. You need 1 dose at age 85 (or older) if you have never been vaccinated.  Tetanus, diphtheria, pertussis (Tdap, Td). Get 1 dose of Tdap vaccine if you are younger than age 61, are over 26 and have contact with an infant, are a Research scientist (physical sciences), are pregnant, or simply want to  be protected from whooping cough. After that, you need a Td booster dose every 10 years. Consult your caregiver if you have not had at least 3 tetanus and diphtheria-containing shots sometime in your life or have a deep or dirty wound.  HPV. You need this vaccine if you are a woman age 82 or younger. The vaccine is given in 3 doses over 6 months.  Measles, mumps, rubella (MMR). You need at least 1 dose of MMR if you were born in 1957 or later. You may also need a second dose.  Meningococcal. If you are age 27 to 85 and a first-year college student living in a residence hall, or have one of several medical conditions, you need to get vaccinated against meningococcal disease. You may also need additional booster doses.  Zoster (shingles). If you are age 37 or older, you should get this vaccine.  Varicella (chickenpox). If you have never had chickenpox or you were vaccinated but received only 1 dose, talk to your caregiver to find out if you need this vaccine.  Hepatitis A. You need this vaccine if you have a specific risk factor for hepatitis A virus infection or you simply wish to be protected from this disease. The vaccine is usually given as 2 doses, 6 to 18 months apart.  Hepatitis B. You need this vaccine if you have a specific risk factor for hepatitis B virus infection or you simply wish to be protected from this disease. The vaccine is given in 3 doses, usually over 6 months. Preventive Services /  Frequency Ages 72 to 53  Blood pressure check.** / Every 1 to 2 years.  Lipid and cholesterol check.** / Every 5 years beginning at age 70.  Clinical breast exam.** / Every 3 years for women in their 58s and 30s.  Pap test.** / Every 2 years from ages 17 through 66. Every 3 years starting at age 41 through age 31 or 36 with a history of 3 consecutive normal Pap tests.  HPV screening.** / Every 3 years from ages 78 through ages 45 to 13 with a history of 3 consecutive normal Pap tests.  Hepatitis C blood test.** / For any individual with known risks for hepatitis C.  Skin self-exam. / Monthly.  Influenza immunization.** / Every year.  Pneumococcal polysaccharide immunization.** / 1 to 2 doses if you smoke cigarettes or if you have certain chronic medical conditions.  Tetanus, diphtheria, pertussis (Tdap, Td) immunization. / A one-time dose of Tdap vaccine. After that, you need a Td booster dose every 10 years.  HPV immunization. / 3 doses over 6 months, if you are 21 and younger.  Measles, mumps, rubella (MMR) immunization. / You need at least 1 dose of MMR if you were born in 1957 or later. You may also need a second dose.  Meningococcal immunization. / 1 dose if you are age 33 to 87 and a first-year college student living in a residence hall, or have one of several medical conditions, you need to get vaccinated against meningococcal disease. You may also need additional booster doses.  Varicella immunization.** / Consult your caregiver.  Hepatitis A immunization.** / Consult your caregiver. 2 doses, 6 to 18 months apart.  Hepatitis B immunization.** / Consult your caregiver. 3 doses usually over 6 months. Ages 81 to 41  Blood pressure check.** / Every 1 to 2 years.  Lipid and cholesterol check.** / Every 5 years beginning at age 71.  Clinical breast exam.** / Every year after  age 84.  Mammogram.** / Every year beginning at age 29 and continuing for as long as you are in  good health. Consult with your caregiver.  Pap test.** / Every 3 years starting at age 80 through age 88 or 62 with a history of 3 consecutive normal Pap tests.  HPV screening.** / Every 3 years from ages 88 through ages 78 to 25 with a history of 3 consecutive normal Pap tests.  Fecal occult blood test (FOBT) of stool. / Every year beginning at age 48 and continuing until age 58. You may not need to do this test if you get a colonoscopy every 10 years.  Flexible sigmoidoscopy or colonoscopy.** / Every 5 years for a flexible sigmoidoscopy or every 10 years for a colonoscopy beginning at age 16 and continuing until age 38.  Hepatitis C blood test.** / For all people born from [birth year] through 1965 and any individual with known risks for hepatitis C.  Skin self-exam. / Monthly.  Influenza immunization.** / Every year.  Pneumococcal polysaccharide immunization.** / 1 to 2 doses if you smoke cigarettes or if you have certain chronic medical conditions.  Tetanus, diphtheria, pertussis (Tdap, Td) immunization.** / A one-time dose of Tdap vaccine. After that, you need a Td booster dose every 10 years.  Measles, mumps, rubella (MMR) immunization. / You need at least 1 dose of MMR if you were born in 1957 or later. You may also need a second dose.  Varicella immunization.** / Consult your caregiver.  Meningococcal immunization.** / Consult your caregiver.  Hepatitis A immunization.** / Consult your caregiver. 2 doses, 6 to 18 months apart.  Hepatitis B immunization.** / Consult your caregiver. 3 doses, usually over 6 months. Ages 28 and over  Blood pressure check.** / Every 1 to 2 years.  Lipid and cholesterol check.** / Every 5 years beginning at age 21.  Clinical breast exam.** / Every year after age 66.  Mammogram.** / Every year beginning at age 71 and continuing for as long as you are in good health. Consult with your caregiver.  Pap test.** / Every 3 years starting at age 72 through  age 79 or 63 with a 3 consecutive normal Pap tests. Testing can be stopped between 65 and 70 with 3 consecutive normal Pap tests and no abnormal Pap or HPV tests in the past 10 years.  HPV screening.** / Every 3 years from ages 25 through ages 36 or 98 with a history of 3 consecutive normal Pap tests. Testing can be stopped between 65 and 70 with 3 consecutive normal Pap tests and no abnormal Pap or HPV tests in the past 10 years.  Fecal occult blood test (FOBT) of stool. / Every year beginning at age 34 and continuing until age 41. You may not need to do this test if you get a colonoscopy every 10 years.  Flexible sigmoidoscopy or colonoscopy.** / Every 5 years for a flexible sigmoidoscopy or every 10 years for a colonoscopy beginning at age 36 and continuing until age 60.  Hepatitis C blood test.** / For all people born from [birth year] through 1965 and any individual with known risks for hepatitis C.  Osteoporosis screening.** / A one-time screening for women ages 71 and over and women at risk for fractures or osteoporosis.  Skin self-exam. / Monthly.  Influenza immunization.** / Every year.  Pneumococcal polysaccharide immunization.** / 1 dose at age 54 (or older) if you have never been vaccinated.  Tetanus, diphtheria, pertussis (Tdap, Td) immunization. /  A one-time dose of Tdap vaccine if you are over 65 and have contact with an infant, are a Research scientist (physical sciences), or simply want to be protected from whooping cough. After that, you need a Td booster dose every 10 years.  Varicella immunization.** / Consult your caregiver.  Meningococcal immunization.** / Consult your caregiver.  Hepatitis A immunization.** / Consult your caregiver. 2 doses, 6 to 18 months apart.  Hepatitis B immunization.** / Check with your caregiver. 3 doses, usually over 6 months. ** Family history and personal history of risk and conditions may change your caregiver's recommendations. Document Released: 02/13/2001 Document  Revised: 03/12/2011 Document Reviewed: 05/15/2010 [facility] Patient Information 2014 [place], [place].

## 2012-06-03 NOTE — Progress Notes (Signed)
Patient ID: Jordan Juarez, female   DOB: 1960-07-31, 52 y.o.   MRN: [id number] Geoffrey Mankin [id number] 1960-11-06 06/03/2012      Progress Note-Follow Up  Subjective  Chief Complaint  Chief Complaint  Patient presents with  . Establish Care    new patient    HPI  Patient is a 52 year old Caucasian female in today for new patient appointment. She is in need of thyroid disease. She reports otherwise generally feeling well. No recent illness. No complaints of headache, chest pain palpitations, shortness of, GI or GU concerns at this time. Has had breast biopsies in the past but always were benign. She is a nodular thyroid which has been stable For many years.  Past Medical History  Diagnosis Date  . PONV (postoperative nausea and vomiting)   . Hypothyroidism   . Chicken pox as a child  . Unspecified hypothyroidism 06/03/2012  . Allergic state 06/03/2012  . Preventative health care 06/03/2012  . Thyroid nodule 06/03/2012  . Sun-damaged skin 06/03/2012    Past Surgical History  Procedure Laterality Date  . Endometrial ablation  2009    done in office- n&v postop  . Laparoscopic assisted vaginal hysterectomy  07/13/2010    Procedure: LAPAROSCOPIC ASSISTED VAGINAL HYSTERECTOMY;  Surgeon: Meriel Pica;  Location: [facility];  Service: Gynecology;  Laterality: N/A;  . Abdominal hysterectomy  07-13-10    still has ovaries  . Mole removal Right     forehead, 4th grade  . Breast surgery      left breast biopsies    Family History  Problem Relation Age of Onset  . Heart attack Mother   . Emphysema Mother     smoker  . Thyroid disease Mother   . Hypertension Father   . Leukemia Sister     acute/ smoker  . Thyroid disease Sister   . Depression Sister     serious MVA, chronic pain  . Cancer Maternal Grandmother     lung/ smoker  . Diabetes Maternal Grandmother     type 2  . Stroke Maternal Grandfather     several  . Hypertension Maternal Grandfather   . Cancer Paternal  Grandfather     bone/ smoker  . Cancer Daughter     2 abnormal skin lesion excisions at age 4 first time  . Asthma Son     History   Social History  . Marital Status: Married    Spouse Name: N/A    Number of Children: N/A  . Years of Education: N/A   Occupational History  . Not on file.   Social History Main Topics  . Smoking status: Never Smoker   . Smokeless tobacco: Not on file  . Alcohol Use: 1.2 oz/week    2 Glasses of wine per week  . Drug Use: No  . Sexually Active: Yes   Other Topics Concern  . Not on file   Social History Narrative  . No narrative on file    Current Outpatient Prescriptions on File Prior to Visit  Medication Sig Dispense Refill  . levothyroxine (SYNTHROID, LEVOTHROID) 137 MCG tablet Take 137 mcg by mouth daily.         No current facility-administered medications on file prior to visit.    No Known Allergies  Review of Systems  Review of Systems  Constitutional: Negative for fever, chills and malaise/fatigue.  HENT: Negative for hearing loss, nosebleeds and congestion.   Eyes: Negative for discharge.  Respiratory: Negative for cough, sputum production,  shortness of breath and wheezing.   Cardiovascular: Negative for chest pain, palpitations and leg swelling.  Gastrointestinal: Negative for heartburn, nausea, vomiting, abdominal pain, diarrhea, constipation and blood in stool.  Genitourinary: Negative for dysuria, urgency, frequency and hematuria.  Musculoskeletal: Negative for myalgias, back pain and falls.  Skin: Negative for rash.  Neurological: Negative for dizziness, tremors, sensory change, focal weakness, loss of consciousness, weakness and headaches.  Endo/Heme/Allergies: Negative for polydipsia. Does not bruise/bleed easily.  Psychiatric/Behavioral: Negative for depression and suicidal ideas. The patient is not nervous/anxious and does not have insomnia.     Objective  BP 100/68  Pulse 69  Temp(Src) 98.3 F (36.8 C)  (Oral)  Ht 5' 3.5" (1.613 m)  Wt 162 lb 1.3 oz (73.519 kg)  BMI 28.26 kg/m2  SpO2 98%  LMP 07/05/2010  Physical Exam  Physical Exam  Constitutional: She is oriented to person, place, and time and well-developed, well-nourished, and in no distress. No distress.  HENT:  Head: Normocephalic and atraumatic.  Right Ear: External ear normal.  Left Ear: External ear normal.  Nose: Nose normal.  Mouth/Throat: Oropharynx is clear and moist. No oropharyngeal exudate.  Eyes: Conjunctivae are normal. Pupils are equal, round, and reactive to light. Right eye exhibits no discharge. Left eye exhibits no discharge. No scleral icterus.  Neck: Normal range of motion. Neck supple. No thyromegaly present.  Cardiovascular: Normal rate, regular rhythm, normal heart sounds and intact distal pulses.   No murmur heard. Pulmonary/Chest: Effort normal and breath sounds normal. No respiratory distress. She has no wheezes. She has no rales.  Abdominal: Soft. Bowel sounds are normal. She exhibits no distension and no mass. There is no tenderness.  Musculoskeletal: Normal range of motion. She exhibits no edema and no tenderness.  Lymphadenopathy:    She has no cervical adenopathy.  Neurological: She is alert and oriented to person, place, and time. She has normal reflexes. No cranial nerve deficit. Coordination normal.  Skin: Skin is warm and dry. No rash noted. She is not diaphoretic.  Psychiatric: Mood, memory and affect normal.    No results found for this basename: TSH   Lab Results  Component Value Date   WBC 6.9 07/14/2010   HGB 10.6* 07/14/2010   HCT 31.6* 07/14/2010   MCV 96.6 07/14/2010   PLT 139* 07/14/2010     Assessment & Plan  Unspecified hypothyroidism Allowed refill on Levothyroxine while we await lab results.  Anemia Historical, last noted on 2010. Will recheck cbc with labs  Sun-damaged skin Encouraged minimize sun exposure and routine dermatology exam  Preventative health  care Referred for colonoscopy, receives gyn care with Dr Marcelle Overlie. Encouraged DASH diet and increased exercise.   Thyroid nodule No concerns on exam consider follow up Ultrasound at later visit.

## 2012-06-04 ENCOUNTER — Other Ambulatory Visit: Payer: Self-pay | Admitting: Obstetrics and Gynecology

## 2012-06-04 ENCOUNTER — Encounter: Payer: Self-pay | Admitting: Internal Medicine

## 2012-06-04 DIAGNOSIS — N63 Unspecified lump in unspecified breast: Secondary | ICD-10-CM

## 2012-06-08 NOTE — Assessment & Plan Note (Signed)
Referred for colonoscopy, receives gyn care with Dr Marcelle Overlie. Encouraged DASH diet and increased exercise.

## 2012-06-08 NOTE — Assessment & Plan Note (Signed)
Encouraged minimize sun exposure and routine dermatology exam

## 2012-06-08 NOTE — Assessment & Plan Note (Signed)
No concerns on exam consider follow up Ultrasound at later visit.

## 2012-06-08 NOTE — Assessment & Plan Note (Signed)
Historical, last noted on 2010. Will recheck cbc with labs

## 2012-06-08 NOTE — Assessment & Plan Note (Signed)
Allowed refill on Levothyroxine while we await lab results.

## 2012-06-18 ENCOUNTER — Other Ambulatory Visit: Payer: 59

## 2012-06-30 ENCOUNTER — Ambulatory Visit
Admission: RE | Admit: 2012-06-30 | Discharge: 2012-06-30 | Disposition: A | Discharge status: Home / Self Care | Payer: 59 | Source: Ambulatory Visit | Attending: Obstetrics and Gynecology | Admitting: Obstetrics and Gynecology

## 2012-06-30 DIAGNOSIS — N63 Unspecified lump in unspecified breast: Secondary | ICD-10-CM

## 2012-07-23 ENCOUNTER — Ambulatory Visit (AMBULATORY_SURGERY_CENTER): Payer: 59 | Admitting: *Deleted

## 2012-07-23 VITALS — Ht 63.5 in | Wt 157.2 lb

## 2012-07-23 DIAGNOSIS — Z1211 Encounter for screening for malignant neoplasm of colon: Secondary | ICD-10-CM

## 2012-07-23 MED ORDER — MOVIPREP 100 G PO SOLR: 1.0000 | Freq: Once | ORAL | Status: DC

## 2012-07-23 NOTE — Progress Notes (Signed)
No egg or soy allergy. ewm No home 02 use. ewm No problems with past sedation except post op nausea/vomiting. ewm No previous colon. ewm emmi video to pts email. ewm

## 2012-08-06 ENCOUNTER — Ambulatory Visit (AMBULATORY_SURGERY_CENTER): Payer: 59 | Admitting: Internal Medicine

## 2012-08-06 ENCOUNTER — Encounter: Payer: Self-pay | Admitting: Internal Medicine

## 2012-08-06 VITALS — BP 97/59 | HR 59 | Temp 97.6°F | Resp 22 | Ht 63.5 in | Wt 157.0 lb

## 2012-08-06 DIAGNOSIS — Z1211 Encounter for screening for malignant neoplasm of colon: Secondary | ICD-10-CM

## 2012-08-06 MED ORDER — SODIUM CHLORIDE 0.9 % IV SOLN: 500.0000 mL | INTRAVENOUS | Status: DC

## 2012-08-06 NOTE — Progress Notes (Signed)
NO EGG OR SOY ALLERGY. EMW 

## 2012-08-06 NOTE — Progress Notes (Signed)
Lidocaine-40mg IV prior to Propofol InductionPropofol given over incremental dosages 

## 2012-08-06 NOTE — Patient Instructions (Addendum)
YOU HAD AN ENDOSCOPIC PROCEDURE TODAY AT THE [facility]: Refer to the procedure report that was given to you for any specific questions about what was found during the examination.  If the procedure report does not answer your questions, please call your gastroenterologist to clarify.  If you requested that your care partner not be given the details of your procedure findings, then the procedure report has been included in a sealed envelope for you to review at your convenience later.  YOU SHOULD EXPECT: Some feelings of bloating in the abdomen. Passage of more gas than usual.  Walking can help get rid of the air that was put into your GI tract during the procedure and reduce the bloating. If you had a lower endoscopy (such as a colonoscopy or flexible sigmoidoscopy) you may notice spotting of blood in your stool or on the toilet paper. If you underwent a bowel prep for your procedure, then you may not have a normal bowel movement for a few days.  DIET: Your first meal following the procedure should be a light meal and then it is ok to progress to your normal diet.  A half-sandwich or bowl of soup is an example of a good first meal.  Heavy or fried foods are harder to digest and may make you feel nauseous or bloated.  Likewise meals heavy in dairy and vegetables can cause extra gas to form and this can also increase the bloating.  Drink plenty of fluids but you should avoid alcoholic beverages for 24 hours.  ACTIVITY: Your care partner should take you home directly after the procedure.  You should plan to take it easy, moving slowly for the rest of the day.  You can resume normal activity the day after the procedure however you should NOT DRIVE or use heavy machinery for 24 hours (because of the sedation medicines used during the test).    SYMPTOMS TO REPORT IMMEDIATELY: A gastroenterologist can be reached at any hour.  During normal business hours, 8:30 AM to 5:00 PM Monday through Friday,  call [phone].  After hours and on weekends, please call the GI answering service at [phone] who will take a message and have the physician on call contact you.   Following lower endoscopy (colonoscopy or flexible sigmoidoscopy):  Excessive amounts of blood in the stool  Significant tenderness or worsening of abdominal pains  Swelling of the abdomen that is new, acute  Fever of 100F or higher    FOLLOW UP: If any biopsies were taken you will be contacted by phone or by letter within the next 1-3 weeks.  Call your gastroenterologist if you have not heard about the biopsies in 3 weeks.  Our staff will call the home number listed on your records the next business day following your procedure to check on you and address any questions or concerns that you may have at that time regarding the information given to you following your procedure. This is a courtesy call and so if there is no answer at the home number and we have not heard from you through the emergency physician on call, we will assume that you have returned to your regular daily activities without incident.  SIGNATURES/CONFIDENTIALITY: You and/or your care partner have signed paperwork which will be entered into your electronic medical record.  These signatures attest to the fact that that the information above on your After Visit Summary has been reviewed and is understood.  Full responsibility of the confidentiality   of this discharge information lies with you and/or your care-partner.  Normal colonoscopy.  Repeat in 10 years-2024  Hemorrhoid information given.

## 2012-08-06 NOTE — Op Note (Signed)
[facility] [address] [place], [address]   COLONOSCOPY PROCEDURE REPORT  PATIENT: Jordan Juarez, Jordan Juarez  MR#: [id number] BIRTHDATE: Apr 04, 1960 , 52  yrs. old GENDER: Female ENDOSCOPIST: Beverley Fiedler, MD REFERRED XB:JYNWG Abner Greenspan, M.D. PROCEDURE DATE:  08/06/2012 PROCEDURE:   Colonoscopy, screening First Screening Colonoscopy - Avg.  risk and is 50 yrs.  old or older Yes.  Prior Negative Screening - Now for repeat screening. N/A  History of Adenoma - Now for follow-up colonoscopy & has been > or = to 3 yrs.  N/A  Polyps Removed Today? No.  Recommend repeat exam, <10 yrs? No. ASA CLASS:   Class II INDICATIONS:average risk screening and first colonoscopy. MEDICATIONS: MAC sedation, administered by CRNA and propofol (Diprivan) 300mg  IV  DESCRIPTION OF PROCEDURE:   After the risks benefits and alternatives of the procedure were thoroughly explained, informed consent was obtained.  A digital rectal exam revealed no rectal mass.   The LB [id number]-[id number]  endoscope was introduced through the anus and advanced to the terminal ileum which was intubated for a short distance. No adverse events experienced.   The quality of the prep was good, using MoviPrep  The instrument was then slowly withdrawn as the colon was fully examined.   COLON FINDINGS: The mucosa appeared normal in the terminal ileum. A normal appearing cecum, ileocecal valve, and appendiceal orifice were identified.  The ascending, hepatic flexure, transverse, splenic flexure, descending, sigmoid colon and rectum appeared unremarkable.  No polyps or cancers were seen.  Retroflexed views revealed small external hemorrhoids and skin tag. The time to cecum=4 minutes 15 seconds.  Withdrawal time=11 minutes 00 seconds. The scope was withdrawn and the procedure completed. COMPLICATIONS: There were no complications.  ENDOSCOPIC IMPRESSION: 1.   Normal mucosa in the terminal ileum 2.   Normal colon 3.   Small  external hemorrhoids  RECOMMENDATIONS: You should continue to follow colorectal cancer screening guidelines for "routine risk" patients with a repeat colonoscopy in 10 years. There is no need for FOBT (stool) testing for at least 5 years.   eSigned:  Beverley Fiedler, MD 08/06/2012 11:23 AM   cc: The Patient and Reuel Derby, MD

## 2012-08-06 NOTE — Progress Notes (Signed)
Patient did not experience any of the following events: a burn prior to discharge; a fall within the facility; wrong site/side/patient/procedure/implant event; or a hospital transfer or hospital admission upon discharge from the facility. (G8907) Patient did not have preoperative order for IV antibiotic SSI prophylaxis. (G8918)  

## 2012-08-08 ENCOUNTER — Telehealth: Payer: Self-pay | Admitting: *Deleted

## 2012-08-08 NOTE — Telephone Encounter (Signed)
  Follow up Call-  Call back number 08/06/2012  Post procedure Call Back phone  # [phone]  Permission to leave phone message Yes     Patient questions:  Do you have a fever, pain , or abdominal swelling? no Pain Score  0 *  Have you tolerated food without any problems? yes  Have you been able to return to your normal activities? yes  Do you have any questions about your discharge instructions: Diet   no Medications  no Follow up visit  no  Do you have questions or concerns about your Care? no  Actions: * If pain score is 4 or above: No action needed, pain <4.

## 2013-01-19 ENCOUNTER — Encounter: Payer: Self-pay | Admitting: Family Medicine

## 2013-01-19 ENCOUNTER — Telehealth: Payer: Self-pay

## 2013-01-19 DIAGNOSIS — Z0000 Encounter for general adult medical examination without abnormal findings: Secondary | ICD-10-CM

## 2013-01-19 DIAGNOSIS — E039 Hypothyroidism, unspecified: Secondary | ICD-10-CM

## 2013-01-19 DIAGNOSIS — D649 Anemia, unspecified: Secondary | ICD-10-CM

## 2013-01-19 LAB — CBC WITH DIFFERENTIAL/PLATELET
BASOS PCT: 1 % (ref 0–1)
Basophils Absolute: 0 10*3/uL (ref 0.0–0.1)
EOS ABS: 0.2 10*3/uL (ref 0.0–0.7)
Eosinophils Relative: 5 % (ref 0–5)
HCT: 40.6 % (ref 36.0–46.0)
HEMOGLOBIN: 13.7 g/dL (ref 12.0–15.0)
Lymphocytes Relative: 46 % (ref 12–46)
Lymphs Abs: 1.7 10*3/uL (ref 0.7–4.0)
MCH: 31.2 pg (ref 26.0–34.0)
MCHC: 33.7 g/dL (ref 30.0–36.0)
MCV: 92.5 fL (ref 78.0–100.0)
MONO ABS: 0.4 10*3/uL (ref 0.1–1.0)
MONOS PCT: 10 % (ref 3–12)
NEUTROS PCT: 38 % — AB (ref 43–77)
Neutro Abs: 1.4 10*3/uL — ABNORMAL LOW (ref 1.7–7.7)
Platelets: 228 10*3/uL (ref 150–400)
RBC: 4.39 MIL/uL (ref 3.87–5.11)
RDW: 13.8 % (ref 11.5–15.5)
WBC: 3.6 10*3/uL — ABNORMAL LOW (ref 4.0–10.5)

## 2013-01-19 NOTE — Telephone Encounter (Signed)
Lab order placed.

## 2013-01-20 LAB — HEPATIC FUNCTION PANEL
ALBUMIN: 4.3 g/dL (ref 3.5–5.2)
ALT: 14 U/L (ref 0–35)
AST: 18 U/L (ref 0–37)
Alkaline Phosphatase: 64 U/L (ref 39–117)
BILIRUBIN TOTAL: 0.8 mg/dL (ref 0.3–1.2)
Bilirubin, Direct: 0.2 mg/dL (ref 0.0–0.3)
Indirect Bilirubin: 0.6 mg/dL (ref 0.0–0.9)
Total Protein: 6.6 g/dL (ref 6.0–8.3)

## 2013-01-20 LAB — RENAL FUNCTION PANEL
Albumin: 4.3 g/dL (ref 3.5–5.2)
BUN: 13 mg/dL (ref 6–23)
CALCIUM: 9.6 mg/dL (ref 8.4–10.5)
CO2: 28 mEq/L (ref 19–32)
CREATININE: 0.69 mg/dL (ref 0.50–1.10)
Chloride: 103 mEq/L (ref 96–112)
GLUCOSE: 91 mg/dL (ref 70–99)
POTASSIUM: 4.3 meq/L (ref 3.5–5.3)
Phosphorus: 3.3 mg/dL (ref 2.3–4.6)
Sodium: 140 mEq/L (ref 135–145)

## 2013-01-20 LAB — TSH: TSH: 0.067 u[IU]/mL — ABNORMAL LOW (ref 0.350–4.500)

## 2013-01-20 LAB — LIPID PANEL
CHOLESTEROL: 167 mg/dL (ref 0–200)
HDL: 64 mg/dL (ref >39)
LDL Cholesterol: 94 mg/dL (ref 0–99)
Total CHOL/HDL Ratio: 2.6 Ratio
Triglycerides: 43 mg/dL (ref <150)
VLDL: 9 mg/dL (ref 0–40)

## 2013-01-27 ENCOUNTER — Telehealth: Payer: Self-pay

## 2013-01-27 DIAGNOSIS — E039 Hypothyroidism, unspecified: Secondary | ICD-10-CM

## 2013-01-27 MED ORDER — LEVOTHYROXINE SODIUM 125 MCG PO TABS: 125.0000 ug | ORAL_TABLET | Freq: Every day | ORAL | Status: DC

## 2013-01-27 NOTE — Telephone Encounter (Signed)
Lab orders and new rx sent

## 2013-11-04 ENCOUNTER — Other Ambulatory Visit: Payer: Self-pay | Admitting: Family Medicine

## 2013-11-04 NOTE — Telephone Encounter (Signed)
30 day supply of levothyroxine sent to pt's pharmacy. Per last lab note, pt is past due for TSH. Please call pt to arrange.

## 2014-01-14 ENCOUNTER — Telehealth: Payer: Self-pay | Admitting: Family Medicine

## 2014-01-14 NOTE — Telephone Encounter (Signed)
Caller name: Sorter, Chaunice Relation to pt: self  Call back number:[phone] Pharmacy: [id number] - [place], [place] - [address] [phone] (Phone)     Reason for call:  Pt requesting a refill levothyroxine (SYNTHROID, LEVOTHROID) 125 MCG tablet to hold her over until next appointment which is 01/25/14

## 2014-01-15 MED ORDER — LEVOTHYROXINE SODIUM 125 MCG PO TABS: ORAL_TABLET | ORAL | Status: DC

## 2014-01-15 NOTE — Telephone Encounter (Signed)
On 01/20/13 patient was advised to have a repeat TSH in 12 weeks due to abnormal lab, She had not been rechecked. She has a pending apt on 01/25/14. Please advise if it is ok to fill.     KP

## 2014-01-15 NOTE — Telephone Encounter (Signed)
She can have 1 month supply but no refills til seen

## 2014-01-15 NOTE — Telephone Encounter (Signed)
patient aware Rx has been sent.      KP

## 2014-01-25 ENCOUNTER — Telehealth: Payer: Self-pay | Admitting: *Deleted

## 2014-01-25 ENCOUNTER — Ambulatory Visit: Payer: 59 | Admitting: Family Medicine

## 2014-01-25 NOTE — Telephone Encounter (Signed)
No show fee's will not be charged today due to weather

## 2014-01-25 NOTE — Telephone Encounter (Signed)
Pt did not show for appointment 01/25/14 at 8:00am for med check.  Charge no show fee?

## 2014-02-16 ENCOUNTER — Ambulatory Visit (INDEPENDENT_AMBULATORY_CARE_PROVIDER_SITE_OTHER): Payer: 59 | Admitting: Family Medicine

## 2014-02-16 ENCOUNTER — Encounter: Payer: Self-pay | Admitting: Family Medicine

## 2014-02-16 VITALS — BP 112/76 | HR 73 | Temp 98.0°F | Ht 64.0 in | Wt 159.2 lb

## 2014-02-16 DIAGNOSIS — T7840XD Allergy, unspecified, subsequent encounter: Secondary | ICD-10-CM

## 2014-02-16 DIAGNOSIS — E039 Hypothyroidism, unspecified: Secondary | ICD-10-CM

## 2014-02-16 MED ORDER — LEVOTHYROXINE SODIUM 125 MCG PO TABS: ORAL_TABLET | ORAL | Status: DC

## 2014-02-16 NOTE — Patient Instructions (Signed)
Hypothyroidism The thyroid is a large gland located in the lower front of your neck. The thyroid gland helps control metabolism. Metabolism is how your body handles food. It controls metabolism with the hormone thyroxine. When this gland is underactive (hypothyroid), it produces too little hormone.  CAUSES These include:   Absence or destruction of thyroid tissue.  Goiter due to iodine deficiency.  Goiter due to medications.  Congenital defects (since birth).  Problems with the pituitary. This causes a lack of TSH (thyroid stimulating hormone). This hormone tells the thyroid to turn out more hormone. SYMPTOMS  Lethargy (feeling as though you have no energy)  Cold intolerance  Weight gain (in spite of normal food intake)  Dry skin  Coarse hair  Menstrual irregularity (if severe, may lead to infertility)  Slowing of thought processes Cardiac problems are also caused by insufficient amounts of thyroid hormone. Hypothyroidism in the newborn is cretinism, and is an extreme form. It is important that this form be treated adequately and immediately or it will lead rapidly to retarded physical and mental development. DIAGNOSIS  To prove hypothyroidism, your caregiver may do blood tests and ultrasound tests. Sometimes the signs are hidden. It may be necessary for your caregiver to watch this illness with blood tests either before or after diagnosis and treatment. TREATMENT  Low levels of thyroid hormone are increased by using synthetic thyroid hormone. This is a safe, effective treatment. It usually takes about four weeks to gain the full effects of the medication. After you have the full effect of the medication, it will generally take another four weeks for problems to leave. Your caregiver may start you on low doses. If you have had heart problems the dose may be gradually increased. It is generally not an emergency to get rapidly to normal. HOME CARE INSTRUCTIONS   Take your  medications as your caregiver suggests. Let your caregiver know of any medications you are taking or start taking. Your caregiver will help you with dosage schedules.  As your condition improves, your dosage needs may increase. It will be necessary to have continuing blood tests as suggested by your caregiver.  Report all suspected medication side effects to your caregiver. SEEK MEDICAL CARE IF: Seek medical care if you develop:  Sweating.  Tremulousness (tremors).  Anxiety.  Rapid weight loss.  Heat intolerance.  Emotional swings.  Diarrhea.  Weakness. SEEK IMMEDIATE MEDICAL CARE IF:  You develop chest pain, an irregular heart beat (palpitations), or a rapid heart beat. MAKE SURE YOU:   Understand these instructions.  Will watch your condition.  Will get help right away if you are not doing well or get worse. Document Released: 12/18/2004 Document Revised: 03/12/2011 Document Reviewed: 08/08/2007 ExitCare Patient Information 2015 [employer]. This information is not intended to replace advice given to you by your health care provider. Make sure you discuss any questions you have with your health care provider.  

## 2014-02-16 NOTE — Progress Notes (Signed)
Pre visit review using our clinic review tool, if applicable. No additional management support is needed unless otherwise documented below in the visit note. 

## 2014-02-17 ENCOUNTER — Other Ambulatory Visit: Payer: 59

## 2014-02-17 ENCOUNTER — Other Ambulatory Visit (INDEPENDENT_AMBULATORY_CARE_PROVIDER_SITE_OTHER): Payer: 59

## 2014-02-17 DIAGNOSIS — E039 Hypothyroidism, unspecified: Secondary | ICD-10-CM

## 2014-02-17 LAB — CBC
HCT: 43.3 % (ref 36.0–46.0)
Hemoglobin: 14.4 g/dL (ref 12.0–15.0)
MCHC: 33.4 g/dL (ref 30.0–36.0)
MCV: 95.7 fl (ref 78.0–100.0)
Platelets: 237 10*3/uL (ref 150.0–400.0)
RBC: 4.52 Mil/uL (ref 3.87–5.11)
RDW: 14.3 % (ref 11.5–15.5)
WBC: 4.7 10*3/uL (ref 4.0–10.5)

## 2014-02-17 LAB — COMPREHENSIVE METABOLIC PANEL
ALT: 14 U/L (ref 0–35)
AST: 21 U/L (ref 0–37)
Albumin: 4.5 g/dL (ref 3.5–5.2)
Alkaline Phosphatase: 68 U/L (ref 39–117)
BUN: 13 mg/dL (ref 6–23)
CO2: 30 meq/L (ref 19–32)
Calcium: 9.9 mg/dL (ref 8.4–10.5)
Chloride: 101 mEq/L (ref 96–112)
Creatinine, Ser: 0.86 mg/dL (ref 0.40–1.20)
GFR: 73.47 mL/min (ref >60.00)
GLUCOSE: 90 mg/dL (ref 70–99)
POTASSIUM: 4 meq/L (ref 3.5–5.1)
SODIUM: 139 meq/L (ref 135–145)
TOTAL PROTEIN: 7.3 g/dL (ref 6.0–8.3)
Total Bilirubin: 0.7 mg/dL (ref 0.2–1.2)

## 2014-02-17 LAB — T4, FREE: Free T4: 1.23 ng/dL (ref 0.60–1.60)

## 2014-02-17 LAB — TSH: TSH: 8.2 u[IU]/mL — AB (ref 0.35–4.50)

## 2014-02-18 ENCOUNTER — Other Ambulatory Visit: Payer: Self-pay | Admitting: Family Medicine

## 2014-02-18 MED ORDER — LEVOTHYROXINE SODIUM 125 MCG PO TABS: ORAL_TABLET | ORAL | Status: DC

## 2014-03-01 NOTE — Progress Notes (Signed)
Jordan Juarez  [id number] 1960/03/29 03/01/2014      Progress Note-Follow Up  Subjective  Chief Complaint  Chief Complaint  Patient presents with  . Medication Refill    HPI  Patient is a 54 y.o. female in today for routine medical care. Patient is in today for lab work and follow-up. Feeling well. No recent illness or acute complaints. Denies CP/palp/SOB/HA/congestion/fevers/GI or GU c/o. Taking meds as prescribed  Past Medical History  Diagnosis Date  . PONV (postoperative nausea and vomiting)   . Hypothyroidism   . Chicken pox as a child  . Unspecified hypothyroidism 06/03/2012  . Allergic state 06/03/2012  . Preventative health care 06/03/2012  . Thyroid nodule 06/03/2012  . Sun-damaged skin 06/03/2012  . Anemia 06/03/2012  . Breast lesion on mammography 06/03/2012    S/p biopsy on left with marker left behind, benign    Past Surgical History  Procedure Laterality Date  . Endometrial ablation  2009    done in office- n&v postop  . Laparoscopic assisted vaginal hysterectomy  07/13/2010    Procedure: LAPAROSCOPIC ASSISTED VAGINAL HYSTERECTOMY;  Surgeon: Margarette Asal;  Location: [facility];  Service: Gynecology;  Laterality: N/A;  . Abdominal hysterectomy  07-13-10    still has ovaries  . Mole removal Right     forehead, 4th grade  . Breast surgery      left breast biopsies    Family History  Problem Relation Age of Onset  . Heart attack Mother   . Emphysema Mother     smoker  . Thyroid disease Mother   . Hypertension Father   . Leukemia Sister     acute/ smoker  . Thyroid disease Sister   . Depression Sister     serious MVA, chronic pain  . Cancer Maternal Grandmother     lung/ smoker  . Diabetes Maternal Grandmother     type 2  . Stroke Maternal Grandfather     several  . Hypertension Maternal Grandfather   . Cancer Paternal Grandfather     bone/ smoker  . Cancer Daughter     2 abnormal skin lesion excisions at age 66 first time  . Asthma Son   . Colon  cancer Neg Hx   . Rectal cancer Neg Hx   . Stomach cancer Neg Hx     History   Social History  . Marital Status: Married    Spouse Name: N/A  . Number of Children: N/A  . Years of Education: N/A   Occupational History  . Not on file.   Social History Main Topics  . Smoking status: Never Smoker   . Smokeless tobacco: Never Used  . Alcohol Use: 1.2 oz/week    2 Glasses of wine per week  . Drug Use: No  . Sexual Activity: Yes   Other Topics Concern  . Not on file   Social History Narrative    Current Outpatient Prescriptions on File Prior to Visit  Medication Sig Dispense Refill  . acetaminophen (TYLENOL) 500 MG tablet Take 500 mg by mouth every 6 (six) hours as needed for pain.    . cetirizine (ZYRTEC) 10 MG tablet Take 10 mg by mouth daily.    Marland Kitchen ibuprofen (ADVIL) 200 MG tablet Take 200 mg by mouth every 6 (six) hours as needed for pain.     No current facility-administered medications on file prior to visit.    No Known Allergies  Review of Systems  Review of Systems  Constitutional:  Negative for fever and malaise/fatigue.  HENT: Negative for congestion and nosebleeds.   Respiratory: Negative for sputum production and shortness of breath.   Cardiovascular: Negative for chest pain, palpitations and leg swelling.  Gastrointestinal: Negative for heartburn, nausea, vomiting, abdominal pain, diarrhea and constipation.  Genitourinary: Negative for dysuria and frequency.  Musculoskeletal: Negative for myalgias and back pain.  Skin: Negative for rash.  Neurological: Negative for dizziness, loss of consciousness, weakness and headaches.  Endo/Heme/Allergies: Negative for polydipsia.  Psychiatric/Behavioral: Negative for depression. The patient is not nervous/anxious and does not have insomnia.     Objective  BP 112/76 mmHg  Pulse 73  Temp(Src) 98 F (36.7 C) (Oral)  Ht 5\' 4"  (1.626 m)  Wt 159 lb 4 oz (72.235 kg)  BMI 27.32 kg/m2  SpO2 93%  LMP  07/05/2010  Physical Exam  Physical Exam  Constitutional: She is oriented to person, place, and time and well-developed, well-nourished, and in no distress. No distress.  HENT:  Head: Normocephalic and atraumatic.  Right Ear: External ear normal.  Left Ear: External ear normal.  Nose: Nose normal.  Mouth/Throat: Oropharynx is clear and moist. No oropharyngeal exudate.  Eyes: Conjunctivae are normal. Pupils are equal, round, and reactive to light. Right eye exhibits no discharge. Left eye exhibits no discharge. No scleral icterus.  Neck: Normal range of motion. Neck supple. No thyromegaly present.  Cardiovascular: Normal rate, regular rhythm, normal heart sounds and intact distal pulses.   No murmur heard. Pulmonary/Chest: Effort normal and breath sounds normal. No respiratory distress. She has no wheezes. She has no rales.  Abdominal: Soft. Bowel sounds are normal. She exhibits no distension and no mass. There is no tenderness.  Musculoskeletal: Normal range of motion. She exhibits no edema or tenderness.  Lymphadenopathy:    She has no cervical adenopathy.  Neurological: She is alert and oriented to person, place, and time. She has normal reflexes. No cranial nerve deficit. Coordination normal.  Skin: Skin is warm and dry. No rash noted. She is not diaphoretic.  Psychiatric: Mood, memory and affect normal.    Lab Results  Component Value Date   TSH 8.20* 02/17/2014   Lab Results  Component Value Date   WBC 4.7 02/17/2014   HGB 14.4 02/17/2014   HCT 43.3 02/17/2014   MCV 95.7 02/17/2014   PLT 237.0 02/17/2014   Lab Results  Component Value Date   CREATININE 0.86 02/17/2014   BUN 13 02/17/2014   NA 139 02/17/2014   K 4.0 02/17/2014   CL 101 02/17/2014   CO2 30 02/17/2014   Lab Results  Component Value Date   ALT 14 02/17/2014   AST 21 02/17/2014   ALKPHOS 68 02/17/2014   BILITOT 0.7 02/17/2014   Lab Results  Component Value Date   CHOL 167 01/19/2013   Lab  Results  Component Value Date   HDL 64 01/19/2013   Lab Results  Component Value Date   LDLCALC 94 01/19/2013   Lab Results  Component Value Date   TRIG 43 01/19/2013   Lab Results  Component Value Date   CHOLHDL 2.6 01/19/2013     Assessment & Plan  Hypothyroidism TSH elevated and free T4 normal. Will maintain same dose of Levothyroxine but will increase to 2 tabs just on Saturday.    Allergic state No recent flares.

## 2014-03-01 NOTE — Assessment & Plan Note (Signed)
No recent flares 

## 2014-03-01 NOTE — Assessment & Plan Note (Signed)
TSH elevated and free T4 normal. Will maintain same dose of Levothyroxine but will increase to 2 tabs just on Saturday.

## 2014-03-12 ENCOUNTER — Other Ambulatory Visit: Payer: Self-pay

## 2014-03-12 DIAGNOSIS — Z1231 Encounter for screening mammogram for malignant neoplasm of breast: Secondary | ICD-10-CM

## 2014-03-15 ENCOUNTER — Ambulatory Visit
Admission: RE | Admit: 2014-03-15 | Discharge: 2014-03-15 | Disposition: A | Discharge status: Home / Self Care | Payer: 59 | Source: Ambulatory Visit

## 2014-03-15 DIAGNOSIS — Z1231 Encounter for screening mammogram for malignant neoplasm of breast: Secondary | ICD-10-CM

## 2014-06-11 ENCOUNTER — Ambulatory Visit (INDEPENDENT_AMBULATORY_CARE_PROVIDER_SITE_OTHER)
Admission: RE | Admit: 2014-06-11 | Discharge: 2014-06-11 | Disposition: A | Discharge status: Home / Self Care | Payer: 59 | Source: Ambulatory Visit | Attending: Family Medicine | Admitting: Family Medicine

## 2014-06-11 ENCOUNTER — Encounter: Payer: Self-pay | Admitting: Family Medicine

## 2014-06-11 ENCOUNTER — Ambulatory Visit (INDEPENDENT_AMBULATORY_CARE_PROVIDER_SITE_OTHER): Payer: 59 | Admitting: Family Medicine

## 2014-06-11 VITALS — BP 116/60 | HR 70 | Temp 97.8°F | Wt 158.2 lb

## 2014-06-11 DIAGNOSIS — M25572 Pain in left ankle and joints of left foot: Secondary | ICD-10-CM

## 2014-06-11 DIAGNOSIS — S93402A Sprain of unspecified ligament of left ankle, initial encounter: Secondary | ICD-10-CM | POA: Diagnosis not present

## 2014-06-11 NOTE — Progress Notes (Signed)
Dr. Frederico Hamman T. Latrish Mogel, MD, [facility] [place], [address] Phone: [phone] Fax: [phone]  06/11/2014  Patient: Jordan Juarez, MRN: [id number], DOB: Apr 28, 1960, 54 y.o.  Primary Physician:  Penni Homans, MD  Chief Complaint: Ankle Pain  Subjective:   Jordan Juarez is a 54 y.o. very pleasant female patient who presents with the following:  DOI 06/09/2014  Dr. Elease Hashimoto called me and asked me to see Jordan Juarez for a left inversion ankle injury.  She was hiking a couple of days ago, and sustained an inversion injury and subsequently lost her balance and fell to the side.  She struck the lateral aspect of her fibula when she fell at the same time.  Immediately, she was able to ambulate and had to walk one to 2 miles off of the Trail to get off of the Trail.  She has been able to ambulate throughout this time.  She does have pain in the fibula distally.  She does have some significant swelling and no significant bruising thus far.  Thus far, she has been elevating it and putting some ice on it.  Past Medical History, Surgical History, Social History, Family History, Problem List, Medications, and Allergies have been reviewed and updated if relevant.  Patient Active Problem List   Diagnosis Date Noted  . Hypothyroidism 06/03/2012  . Allergic state 06/03/2012  . Preventative health care 06/03/2012  . Thyroid nodule 06/03/2012  . Sun-damaged skin 06/03/2012  . Anemia 06/03/2012  . Breast lesion on mammography 06/03/2012    Past Medical History  Diagnosis Date  . PONV (postoperative nausea and vomiting)   . Hypothyroidism   . Chicken pox as a child  . Unspecified hypothyroidism 06/03/2012  . Allergic state 06/03/2012  . Preventative health care 06/03/2012  . Thyroid nodule 06/03/2012  . Sun-damaged skin 06/03/2012  . Anemia 06/03/2012  . Breast lesion on mammography 06/03/2012    S/p biopsy on left with marker left  behind, benign    Past Surgical History  Procedure Laterality Date  . Endometrial ablation  2009    done in office- n&v postop  . Laparoscopic assisted vaginal hysterectomy  07/13/2010    Procedure: LAPAROSCOPIC ASSISTED VAGINAL HYSTERECTOMY;  Surgeon: Margarette Asal;  Location: [facility];  Service: Gynecology;  Laterality: N/A;  . Abdominal hysterectomy  07-13-10    still has ovaries  . Mole removal Right     forehead, 4th grade  . Breast surgery      left breast biopsies    History   Social History  . Marital Status: Married    Spouse Name: N/A  . Number of Children: N/A  . Years of Education: N/A   Occupational History  . Not on file.   Social History Main Topics  . Smoking status: Never Smoker   . Smokeless tobacco: Never Used  . Alcohol Use: 1.2 oz/week    2 Glasses of wine per week  . Drug Use: No  . Sexual Activity: Yes   Other Topics Concern  . Not on file   Social History Narrative    Family History  Problem Relation Age of Onset  . Heart attack Mother   . Emphysema Mother     smoker  . Thyroid disease Mother   . Hypertension Father   . Leukemia Sister     acute/ smoker  . Thyroid disease Sister   . Depression Sister     serious MVA,  chronic pain  . Cancer Maternal Grandmother     lung/ smoker  . Diabetes Maternal Grandmother     type 2  . Stroke Maternal Grandfather     several  . Hypertension Maternal Grandfather   . Cancer Paternal Grandfather     bone/ smoker  . Cancer Daughter     2 abnormal skin lesion excisions at age 14 first time  . Asthma Son   . Colon cancer Neg Hx   . Rectal cancer Neg Hx   . Stomach cancer Neg Hx     No Known Allergies  Medication list reviewed and updated in full in [facility].  GEN: No fevers, chills. Nontoxic. Primarily MSK c/o today. MSK: Detailed in the HPI GI: tolerating PO intake without difficulty Neuro: No numbness, parasthesias, or tingling associated. Otherwise the pertinent positives  of the ROS are noted above.   Objective:   BP 116/60 mmHg  Pulse 70  Temp(Src) 97.8 F (36.6 C) (Oral)  Wt 158 lb 4 oz (71.782 kg)  SpO2 97%  LMP 07/05/2010   GEN: WDWN, NAD, Non-toxic, Alert & Oriented x 3 HEENT: Atraumatic, Normocephalic.  Ears and Nose: No external deformity. EXTR: No clubbing/cyanosis/edema NEURO: Normal gait. Mild antalgia. PSYCH: Normally interactive. Conversant. Not depressed or anxious appearing.  Calm demeanor.    Left foot and ankle: All toes are nontender.  The entirety of the forefoot is nontender.  The entirety of the midfoot is nontender.  Including navicular, cuboid's, cuneiforms, and the base of the fifth metatarsal.  Nontender along the Achilles tendon.  The calcaneus is nontender.  Squeeze testing is nontender.  Medial malleolus is nontender.  Lateral malleolus is tender to palpation and tender to palpation at the distal fibula.  Nontender at the deltoid ligament.  The ATFL and CFL are both tender to palpation.  Anterior drawer testing causes pain.  Radiology: Dg Ankle Complete Left  06/11/2014   CLINICAL DATA:  Pain.  Initial evaluation.  EXAM: LEFT ANKLE COMPLETE - 3+ VIEW  COMPARISON:  None.  FINDINGS: Soft tissue swelling noted over the lateral malleolus. No evidence of fracture or dislocation.  IMPRESSION: Mild soft tissue swelling.  No acute bony or joint abnormality.   Electronically Signed   By: Marcello Moores  Register   On: 06/11/2014 14:09    The radiological images were independently reviewed by myself in the office and results were reviewed with the patient. My independent interpretation of images:  There is no evidence for occult fracture or dislocation.  The mortise is preserved.  Grossly normal ankle series. Owens Loffler, MD   Assessment and Plan:   Left ankle pain - Plan: DG Ankle Complete Left  Left ankle sprain, initial encounter  ATFL and CFL sprain, grade 1. Bone contusion, distal fibula.  She should do well.  Upcoming  mission trip, recommended elevation and icing for the next few days, and NSAIDs.  Begin patient range of motion and rehabilitation program now. Given [facility] ankle program.  I gave her an ASO ankle brace to use, particularly when she is on her mission trip and walking.  Follow-up: prn  Orders Placed This Encounter  Procedures  . DG Ankle Complete Left    Signed,  Dmya Long T. Laryah Neuser, MD   Patient's Medications  New Prescriptions   No medications on file  Previous Medications   ACETAMINOPHEN (TYLENOL) 500 MG TABLET    Take 500 mg by mouth every 6 (six) hours as needed for pain.   CETIRIZINE (ZYRTEC)  10 MG TABLET    Take 10 mg by mouth daily.   IBUPROFEN (ADVIL) 200 MG TABLET    Take 200 mg by mouth every 6 (six) hours as needed for pain.   LEVOTHYROXINE (SYNTHROID, LEVOTHROID) 125 MCG TABLET    Take 1 by mouth daily except on saturdays take 2 by mouth  Modified Medications   No medications on file  Discontinued Medications   No medications on file

## 2014-06-11 NOTE — Progress Notes (Signed)
Pre visit review using our clinic review tool, if applicable. No additional management support is needed unless otherwise documented below in the visit note. 

## 2014-07-12 ENCOUNTER — Encounter: Payer: Self-pay | Admitting: Family Medicine

## 2014-07-12 ENCOUNTER — Telehealth: Payer: Self-pay | Admitting: Family Medicine

## 2014-07-12 NOTE — Telephone Encounter (Signed)
error 

## 2014-07-29 ENCOUNTER — Encounter: Payer: Self-pay | Admitting: Family Medicine

## 2014-07-29 ENCOUNTER — Ambulatory Visit (INDEPENDENT_AMBULATORY_CARE_PROVIDER_SITE_OTHER): Payer: 59 | Admitting: Family Medicine

## 2014-07-29 VITALS — BP 100/68 | HR 66 | Temp 98.4°F | Ht 64.0 in | Wt 156.5 lb

## 2014-07-29 DIAGNOSIS — D649 Anemia, unspecified: Secondary | ICD-10-CM

## 2014-07-29 DIAGNOSIS — E663 Overweight: Secondary | ICD-10-CM | POA: Diagnosis not present

## 2014-07-29 DIAGNOSIS — E039 Hypothyroidism, unspecified: Secondary | ICD-10-CM

## 2014-07-29 MED ORDER — LEVOTHYROXINE SODIUM 125 MCG PO TABS: ORAL_TABLET | ORAL | Status: DC

## 2014-07-29 NOTE — Patient Instructions (Signed)
DASH Eating Plan °DASH stands for "Dietary Approaches to Stop Hypertension." The DASH eating plan is a healthy eating plan that has been shown to reduce high blood pressure (hypertension). Additional health benefits may include reducing the risk of type 2 diabetes mellitus, heart disease, and stroke. The DASH eating plan may also help with weight loss. °WHAT DO I NEED TO KNOW ABOUT THE DASH EATING PLAN? °For the DASH eating plan, you will follow these general guidelines: °· Choose foods with a percent daily value for sodium of less than 5% (as listed on the food label). °· Use salt-free seasonings or herbs instead of table salt or sea salt. °· Check with your health care provider or pharmacist before using salt substitutes. °· Eat lower-sodium products, often labeled as "lower sodium" or "no salt added." °· Eat fresh foods. °· Eat more vegetables, fruits, and low-fat dairy products. °· Choose whole grains. Look for the word "whole" as the first word in the ingredient list. °· Choose fish and skinless chicken or turkey more often than red meat. Limit fish, poultry, and meat to 6 oz (170 g) each day. °· Limit sweets, desserts, sugars, and sugary drinks. °· Choose heart-healthy fats. °· Limit cheese to 1 oz (28 g) per day. °· Eat more home-cooked food and less restaurant, buffet, and fast food. °· Limit fried foods. °· Cook foods using methods other than frying. °· Limit canned vegetables. If you do use them, rinse them well to decrease the sodium. °· When eating at a restaurant, ask that your food be prepared with less salt, or no salt if possible. °WHAT FOODS CAN I EAT? °Seek help from a dietitian for individual calorie needs. °Grains °Whole grain or whole wheat bread. Brown rice. Whole grain or whole wheat pasta. Quinoa, bulgur, and whole grain cereals. Low-sodium cereals. Corn or whole wheat flour tortillas. Whole grain cornbread. Whole grain crackers. Low-sodium crackers. °Vegetables °Fresh or frozen vegetables  (raw, steamed, roasted, or grilled). Low-sodium or reduced-sodium tomato and vegetable juices. Low-sodium or reduced-sodium tomato sauce and paste. Low-sodium or reduced-sodium canned vegetables.  °Fruits °All fresh, canned (in natural juice), or frozen fruits. °Meat and Other Protein Products °Ground beef (85% or leaner), grass-fed beef, or beef trimmed of fat. Skinless chicken or turkey. Ground chicken or turkey. Pork trimmed of fat. All fish and seafood. Eggs. Dried beans, peas, or lentils. Unsalted nuts and seeds. Unsalted canned beans. °Dairy °Low-fat dairy products, such as skim or 1% milk, 2% or reduced-fat cheeses, low-fat ricotta or cottage cheese, or plain low-fat yogurt. Low-sodium or reduced-sodium cheeses. °Fats and Oils °Tub margarines without trans fats. Light or reduced-fat mayonnaise and salad dressings (reduced sodium). Avocado. Safflower, olive, or canola oils. Natural peanut or almond butter. °Other °Unsalted popcorn and pretzels. °The items listed above may not be a complete list of recommended foods or beverages. Contact your dietitian for more options. °WHAT FOODS ARE NOT RECOMMENDED? °Grains °White bread. White pasta. White rice. Refined cornbread. Bagels and croissants. Crackers that contain trans fat. °Vegetables °Creamed or fried vegetables. Vegetables in a cheese sauce. Regular canned vegetables. Regular canned tomato sauce and paste. Regular tomato and vegetable juices. °Fruits °Dried fruits. Canned fruit in light or heavy syrup. Fruit juice. °Meat and Other Protein Products °Fatty cuts of meat. Ribs, chicken wings, bacon, sausage, bologna, salami, chitterlings, fatback, hot dogs, bratwurst, and packaged luncheon meats. Salted nuts and seeds. Canned beans with salt. °Dairy °Whole or 2% milk, cream, half-and-half, and cream cheese. Whole-fat or sweetened yogurt. Full-fat   cheeses or blue cheese. Nondairy creamers and whipped toppings. Processed cheese, cheese spreads, or cheese  curds. °Condiments °Onion and garlic salt, seasoned salt, table salt, and sea salt. Canned and packaged gravies. Worcestershire sauce. Tartar sauce. Barbecue sauce. Teriyaki sauce. Soy sauce, including reduced sodium. Steak sauce. Fish sauce. Oyster sauce. Cocktail sauce. Horseradish. Ketchup and mustard. Meat flavorings and tenderizers. Bouillon cubes. Hot sauce. Tabasco sauce. Marinades. Taco seasonings. Relishes. °Fats and Oils °Butter, stick margarine, lard, shortening, ghee, and bacon fat. Coconut, palm kernel, or palm oils. Regular salad dressings. °Other °Pickles and olives. Salted popcorn and pretzels. °The items listed above may not be a complete list of foods and beverages to avoid. Contact your dietitian for more information. °WHERE CAN I FIND MORE INFORMATION? °National Heart, Lung, and [employer] Institute: www.nhlbi.nih.gov/health/health-topics/topics/dash/ °Document Released: 12/07/2010 Document Revised: 05/04/2013 Document Reviewed: 10/22/2012 °ExitCare® Patient Information ©2015 [employer]. This information is not intended to replace advice given to you by your health care provider. Make sure you discuss any questions you have with your health care provider. ° °

## 2014-07-29 NOTE — Progress Notes (Signed)
Pre visit review using our clinic review tool, if applicable. No additional management support is needed unless otherwise documented below in the visit note. 

## 2014-07-30 LAB — CBC
HEMATOCRIT: 42.8 % (ref 36.0–46.0)
Hemoglobin: 14.3 g/dL (ref 12.0–15.0)
MCHC: 33.4 g/dL (ref 30.0–36.0)
MCV: 95.2 fl (ref 78.0–100.0)
PLATELETS: 221 10*3/uL (ref 150.0–400.0)
RBC: 4.49 Mil/uL (ref 3.87–5.11)
RDW: 13.9 % (ref 11.5–15.5)
WBC: 5.7 10*3/uL (ref 4.0–10.5)

## 2014-07-30 LAB — COMPREHENSIVE METABOLIC PANEL
ALK PHOS: 81 U/L (ref 39–117)
ALT: 19 U/L (ref 0–35)
AST: 22 U/L (ref 0–37)
Albumin: 4.4 g/dL (ref 3.5–5.2)
BUN: 12 mg/dL (ref 6–23)
CO2: 25 meq/L (ref 19–32)
Calcium: 9.8 mg/dL (ref 8.4–10.5)
Chloride: 103 mEq/L (ref 96–112)
Creatinine, Ser: 0.79 mg/dL (ref 0.40–1.20)
GFR: 80.9 mL/min (ref >60.00)
GLUCOSE: 76 mg/dL (ref 70–99)
POTASSIUM: 4.6 meq/L (ref 3.5–5.1)
Sodium: 139 mEq/L (ref 135–145)
TOTAL PROTEIN: 7 g/dL (ref 6.0–8.3)
Total Bilirubin: 1.1 mg/dL (ref 0.2–1.2)

## 2014-07-30 LAB — TSH: TSH: 0.06 u[IU]/mL — AB (ref 0.35–4.50)

## 2014-08-08 ENCOUNTER — Encounter: Payer: Self-pay | Admitting: Family Medicine

## 2014-08-08 DIAGNOSIS — E663 Overweight: Secondary | ICD-10-CM

## 2014-08-08 HISTORY — DX: Overweight: E66.3

## 2014-08-08 NOTE — Assessment & Plan Note (Signed)
TSH is now suppressed, will drop to 1 tab daily of Levothyroxine and recheck TSH in 3 motnhs

## 2014-08-08 NOTE — Assessment & Plan Note (Signed)
Encouraged DASH diet, decrease po intake and increase exercise as tolerated. Needs 7-8 hours of sleep nightly. Avoid trans fats, eat small, frequent meals every 4-5 hours with lean proteins, complex carbs and healthy fats. Minimize simple carbs, GMO foods. 

## 2014-08-08 NOTE — Progress Notes (Signed)
Jordan Juarez  [id number] 1960/05/30 08/08/2014      Progress Note-Follow Up  Subjective  Chief Complaint  Chief Complaint  Patient presents with  . Follow-up    HPI  Patient is a 54 y.o. female in today for routine medical care. Patient is in for follow-up been doing well.Her biggest frustration is her weight. She has lost weight but has not achieved her goals. She is trying to eat a heart healthy diet. No recent illness. Denies CP/palp/SOB/HA/congestion/fevers/GI or GU c/o. Taking meds as prescribed  Past Medical History  Diagnosis Date  . PONV (postoperative nausea and vomiting)   . Hypothyroidism   . Chicken pox as a child  . Unspecified hypothyroidism 06/03/2012  . Allergic state 06/03/2012  . Preventative health care 06/03/2012  . Thyroid nodule 06/03/2012  . Sun-damaged skin 06/03/2012  . Anemia 06/03/2012  . Breast lesion on mammography 06/03/2012    S/p biopsy on left with marker left behind, benign  . Overweight 08/08/2014    Past Surgical History  Procedure Laterality Date  . Endometrial ablation  2009    done in office- n&v postop  . Laparoscopic assisted vaginal hysterectomy  07/13/2010    Procedure: LAPAROSCOPIC ASSISTED VAGINAL HYSTERECTOMY;  Surgeon: Margarette Asal;  Location: [facility];  Service: Gynecology;  Laterality: N/A;  . Abdominal hysterectomy  07-13-10    still has ovaries  . Mole removal Right     forehead, 4th grade  . Breast surgery      left breast biopsies    Family History  Problem Relation Age of Onset  . Heart attack Mother   . Emphysema Mother     smoker  . Thyroid disease Mother   . Hypertension Father   . Leukemia Sister     acute/ smoker  . Thyroid disease Sister   . Depression Sister     serious MVA, chronic pain  . Cancer Maternal Grandmother     lung/ smoker  . Diabetes Maternal Grandmother     type 2  . Stroke Maternal Grandfather     several  . Hypertension Maternal Grandfather   . Cancer Paternal Grandfather     bone/  smoker  . Cancer Daughter     2 abnormal skin lesion excisions at age 40 first time  . Asthma Son   . Colon cancer Neg Hx   . Rectal cancer Neg Hx   . Stomach cancer Neg Hx     History   Social History  . Marital Status: Married    Spouse Name: N/A  . Number of Children: N/A  . Years of Education: N/A   Occupational History  . Not on file.   Social History Main Topics  . Smoking status: Never Smoker   . Smokeless tobacco: Never Used  . Alcohol Use: 1.2 oz/week    2 Glasses of wine per week  . Drug Use: No  . Sexual Activity: Yes   Other Topics Concern  . Not on file   Social History Narrative    Current Outpatient Prescriptions on File Prior to Visit  Medication Sig Dispense Refill  . acetaminophen (TYLENOL) 500 MG tablet Take 500 mg by mouth every 6 (six) hours as needed for pain.    . cetirizine (ZYRTEC) 10 MG tablet Take 10 mg by mouth daily.    Marland Kitchen ibuprofen (ADVIL) 200 MG tablet Take 200 mg by mouth every 6 (six) hours as needed for pain.     No current facility-administered medications  on file prior to visit.    No Known Allergies  Review of Systems  Review of Systems  Constitutional: Negative for fever and malaise/fatigue.  HENT: Negative for congestion.   Eyes: Negative for discharge.  Respiratory: Negative for shortness of breath.   Cardiovascular: Negative for chest pain, palpitations and leg swelling.  Gastrointestinal: Negative for nausea, abdominal pain and diarrhea.  Genitourinary: Negative for dysuria.  Musculoskeletal: Negative for falls.  Skin: Negative for rash.  Neurological: Negative for loss of consciousness and headaches.  Endo/Heme/Allergies: Negative for polydipsia.  Psychiatric/Behavioral: Negative for depression and suicidal ideas. The patient is not nervous/anxious and does not have insomnia.     Objective  BP 100/68 mmHg  Pulse 66  Temp(Src) 98.4 F (36.9 C) (Oral)  Ht 5\' 4"  (1.626 m)  Wt 156 lb 8 oz (70.988 kg)  BMI 26.85  kg/m2  SpO2 95%  LMP 07/05/2010  Physical Exam  Physical Exam  Constitutional: She is oriented to person, place, and time and well-developed, well-nourished, and in no distress. No distress.  HENT:  Head: Normocephalic and atraumatic.  Eyes: Conjunctivae are normal.  Neck: Neck supple. No thyromegaly present.  Cardiovascular: Normal rate, regular rhythm and normal heart sounds.   No murmur heard. Pulmonary/Chest: Effort normal and breath sounds normal. She has no wheezes.  Abdominal: She exhibits no distension and no mass.  Musculoskeletal: She exhibits no edema.  Lymphadenopathy:    She has no cervical adenopathy.  Neurological: She is alert and oriented to person, place, and time.  Skin: Skin is warm and dry. No rash noted. She is not diaphoretic.  Psychiatric: Memory, affect and judgment normal.    Lab Results  Component Value Date   TSH 0.06* 07/29/2014   Lab Results  Component Value Date   WBC 5.7 07/29/2014   HGB 14.3 07/29/2014   HCT 42.8 07/29/2014   MCV 95.2 07/29/2014   PLT 221.0 07/29/2014   Lab Results  Component Value Date   CREATININE 0.79 07/29/2014   BUN 12 07/29/2014   NA 139 07/29/2014   K 4.6 07/29/2014   CL 103 07/29/2014   CO2 25 07/29/2014   Lab Results  Component Value Date   ALT 19 07/29/2014   AST 22 07/29/2014   ALKPHOS 81 07/29/2014   BILITOT 1.1 07/29/2014   Lab Results  Component Value Date   CHOL 167 01/19/2013   Lab Results  Component Value Date   HDL 64 01/19/2013   Lab Results  Component Value Date   LDLCALC 94 01/19/2013   Lab Results  Component Value Date   TRIG 43 01/19/2013   Lab Results  Component Value Date   CHOLHDL 2.6 01/19/2013     Assessment & Plan  Hypothyroidism TSH is now suppressed, will drop to 1 tab daily of Levothyroxine and recheck TSH in 3 motnhs  Overweight Encouraged DASH diet, decrease po intake and increase exercise as tolerated. Needs 7-8 hours of sleep nightly. Avoid trans fats,  eat small, frequent meals every 4-5 hours with lean proteins, complex carbs and healthy fats. Minimize simple carbs, GMO foods.

## 2014-08-27 ENCOUNTER — Encounter: Payer: 59 | Admitting: Family Medicine

## 2014-11-22 ENCOUNTER — Other Ambulatory Visit: Payer: Self-pay | Admitting: Family Medicine

## 2014-11-22 ENCOUNTER — Other Ambulatory Visit (INDEPENDENT_AMBULATORY_CARE_PROVIDER_SITE_OTHER): Payer: 59

## 2014-11-22 DIAGNOSIS — E039 Hypothyroidism, unspecified: Secondary | ICD-10-CM

## 2014-11-22 DIAGNOSIS — E038 Other specified hypothyroidism: Secondary | ICD-10-CM

## 2014-11-22 LAB — TSH: TSH: 0.11 u[IU]/mL — ABNORMAL LOW (ref 0.35–4.50)

## 2015-01-07 MED FILL — LEVOTHYROXINE 125 MCG TAB: 125 | 83 days supply | Qty: 95 | Fill #0

## 2015-02-09 ENCOUNTER — Telehealth: Payer: Self-pay | Admitting: *Deleted

## 2015-02-09 NOTE — Telephone Encounter (Signed)
Unable to reach patient at time of pre-visit call. No message left as no DPR on file.

## 2015-02-10 ENCOUNTER — Encounter: Payer: Self-pay | Admitting: Family Medicine

## 2015-02-10 ENCOUNTER — Other Ambulatory Visit: Payer: Self-pay | Admitting: Family Medicine

## 2015-02-10 ENCOUNTER — Ambulatory Visit (INDEPENDENT_AMBULATORY_CARE_PROVIDER_SITE_OTHER): Payer: 59 | Admitting: Family Medicine

## 2015-02-10 VITALS — BP 110/72 | HR 74 | Temp 98.2°F | Ht 64.0 in | Wt 159.5 lb

## 2015-02-10 DIAGNOSIS — Z0000 Encounter for general adult medical examination without abnormal findings: Secondary | ICD-10-CM | POA: Diagnosis not present

## 2015-02-10 DIAGNOSIS — N6019 Diffuse cystic mastopathy of unspecified breast: Secondary | ICD-10-CM | POA: Diagnosis not present

## 2015-02-10 DIAGNOSIS — E039 Hypothyroidism, unspecified: Secondary | ICD-10-CM | POA: Diagnosis not present

## 2015-02-10 DIAGNOSIS — Z1231 Encounter for screening mammogram for malignant neoplasm of breast: Secondary | ICD-10-CM

## 2015-02-10 LAB — COMPREHENSIVE METABOLIC PANEL
ALT: 15 U/L (ref 0–35)
AST: 19 U/L (ref 0–37)
Albumin: 4.3 g/dL (ref 3.5–5.2)
Alkaline Phosphatase: 68 U/L (ref 39–117)
BUN: 17 mg/dL (ref 6–23)
CO2: 32 meq/L (ref 19–32)
Calcium: 10.1 mg/dL (ref 8.4–10.5)
Chloride: 103 mEq/L (ref 96–112)
Creatinine, Ser: 0.84 mg/dL (ref 0.40–1.20)
GFR: 75.21 mL/min (ref >60.00)
GLUCOSE: 95 mg/dL (ref 70–99)
POTASSIUM: 5.2 meq/L — AB (ref 3.5–5.1)
SODIUM: 139 meq/L (ref 135–145)
Total Bilirubin: 0.7 mg/dL (ref 0.2–1.2)
Total Protein: 7 g/dL (ref 6.0–8.3)

## 2015-02-10 LAB — LIPID PANEL
Cholesterol: 200 mg/dL (ref 0–200)
HDL: 69.6 mg/dL (ref >39.00)
LDL Cholesterol: 122 mg/dL — ABNORMAL HIGH (ref 0–99)
NONHDL: 130.25
Total CHOL/HDL Ratio: 3
Triglycerides: 42 mg/dL (ref 0.0–149.0)
VLDL: 8.4 mg/dL (ref 0.0–40.0)

## 2015-02-10 LAB — CBC
HEMATOCRIT: 43.8 % (ref 36.0–46.0)
HEMOGLOBIN: 14.4 g/dL (ref 12.0–15.0)
MCHC: 32.9 g/dL (ref 30.0–36.0)
MCV: 96.7 fl (ref 78.0–100.0)
PLATELETS: 217 10*3/uL (ref 150.0–400.0)
RBC: 4.53 Mil/uL (ref 3.87–5.11)
RDW: 13.5 % (ref 11.5–15.5)
WBC: 4.5 10*3/uL (ref 4.0–10.5)

## 2015-02-10 LAB — TSH: TSH: 0.35 u[IU]/mL (ref 0.35–4.50)

## 2015-02-10 NOTE — Progress Notes (Signed)
Pre visit review using our clinic review tool, if applicable. No additional management support is needed unless otherwise documented below in the visit note. 

## 2015-02-10 NOTE — Patient Instructions (Signed)
Preventive Care for Adults, Female A healthy lifestyle and preventive care can promote health and wellness. Preventive health guidelines for women include the following key practices.  A routine yearly physical is a good way to check with your health care provider about your health and preventive screening. It is a chance to share any concerns and updates on your health and to receive a thorough exam.  Visit your dentist for a routine exam and preventive care every 6 months. Brush your teeth twice a day and floss once a day. Good oral hygiene prevents tooth decay and gum disease.  The frequency of eye exams is based on your age, health, family medical history, use of contact lenses, and other factors. Follow your health care provider's recommendations for frequency of eye exams.  Eat a healthy diet. Foods like vegetables, fruits, whole grains, low-fat dairy products, and lean protein foods contain the nutrients you need without too many calories. Decrease your intake of foods high in solid fats, added sugars, and salt. Eat the right amount of calories for you.Get information about a proper diet from your health care provider, if necessary.  Regular physical exercise is one of the most important things you can do for your health. Most adults should get at least 150 minutes of moderate-intensity exercise (any activity that increases your heart rate and causes you to sweat) each week. In addition, most adults need muscle-strengthening exercises on 2 or more days a week.  Maintain a healthy weight. The body mass index (BMI) is a screening tool to identify possible weight problems. It provides an estimate of body fat based on height and weight. Your health care provider can find your BMI and can help you achieve or maintain a healthy weight.For adults 20 years and older:  A BMI below 18.5 is considered underweight.  A BMI of 18.5 to 24.9 is normal.  A BMI of 25 to 29.9 is considered overweight.  A  BMI of 30 and above is considered obese.  Maintain normal blood lipids and cholesterol levels by exercising and minimizing your intake of saturated fat. Eat a balanced diet with plenty of fruit and vegetables. Blood tests for lipids and cholesterol should begin at age 45 and be repeated every 5 years. If your lipid or cholesterol levels are high, you are over 50, or you are at high risk for heart disease, you may need your cholesterol levels checked more frequently.Ongoing high lipid and cholesterol levels should be treated with medicines if diet and exercise are not working.  If you smoke, find out from your health care provider how to quit. If you do not use tobacco, do not start.  Lung cancer screening is recommended for adults aged 45-80 years who are at high risk for developing lung cancer because of a history of smoking. A yearly low-dose CT scan of the lungs is recommended for people who have at least a 30-pack-year history of smoking and are a current smoker or have quit within the past 15 years. A pack year of smoking is smoking an average of 1 pack of cigarettes a day for 1 year (for example: 1 pack a day for 30 years or 2 packs a day for 15 years). Yearly screening should continue until the smoker has stopped smoking for at least 15 years. Yearly screening should be stopped for people who develop a health problem that would prevent them from having lung cancer treatment.  If you are pregnant, do not drink alcohol. If you are  breastfeeding, be very cautious about drinking alcohol. If you are not pregnant and choose to drink alcohol, do not have more than 1 drink per day. One drink is considered to be 12 ounces (355 mL) of beer, 5 ounces (148 mL) of wine, or 1.5 ounces (44 mL) of liquor.  Avoid use of street drugs. Do not share needles with anyone. Ask for help if you need support or instructions about stopping the use of drugs.  High blood pressure causes heart disease and increases the risk  of stroke. Your blood pressure should be checked at least every 1 to 2 years. Ongoing high blood pressure should be treated with medicines if weight loss and exercise do not work.  If you are 55-79 years old, ask your health care provider if you should take aspirin to prevent strokes.  Diabetes screening is done by taking a blood sample to check your blood glucose level after you have not eaten for a certain period of time (fasting). If you are not overweight and you do not have risk factors for diabetes, you should be screened once every 3 years starting at age 45. If you are overweight or obese and you are 40-70 years of age, you should be screened for diabetes every year as part of your cardiovascular risk assessment.  Breast cancer screening is essential preventive care for women. You should practice "breast self-awareness." This means understanding the normal appearance and feel of your breasts and may include breast self-examination. Any changes detected, no matter how small, should be reported to a health care provider. Women in their 20s and 30s should have a clinical breast exam (CBE) by a health care provider as part of a regular health exam every 1 to 3 years. After age 40, women should have a CBE every year. Starting at age 40, women should consider having a mammogram (breast X-ray test) every year. Women who have a family history of breast cancer should talk to their health care provider about genetic screening. Women at a high risk of breast cancer should talk to their health care providers about having an MRI and a mammogram every year.  Breast cancer gene (BRCA)-related cancer risk assessment is recommended for women who have family members with BRCA-related cancers. BRCA-related cancers include breast, ovarian, tubal, and peritoneal cancers. Having family members with these cancers may be associated with an increased risk for harmful changes (mutations) in the breast cancer genes BRCA1 and  BRCA2. Results of the assessment will determine the need for genetic counseling and BRCA1 and BRCA2 testing.  Your health care provider may recommend that you be screened regularly for cancer of the pelvic organs (ovaries, uterus, and vagina). This screening involves a pelvic examination, including checking for microscopic changes to the surface of your cervix (Pap test). You may be encouraged to have this screening done every 3 years, beginning at age 21.  For women ages 30-65, health care providers may recommend pelvic exams and Pap testing every 3 years, or they may recommend the Pap and pelvic exam, combined with testing for human papilloma virus (HPV), every 5 years. Some types of HPV increase your risk of cervical cancer. Testing for HPV may also be done on women of any age with unclear Pap test results.  Other health care providers may not recommend any screening for nonpregnant women who are considered low risk for pelvic cancer and who do not have symptoms. Ask your health care provider if a screening pelvic exam is right for   you.  If you have had past treatment for cervical cancer or a condition that could lead to cancer, you need Pap tests and screening for cancer for at least 20 years after your treatment. If Pap tests have been discontinued, your risk factors (such as having a new sexual partner) need to be reassessed to determine if screening should resume. Some women have medical problems that increase the chance of getting cervical cancer. In these cases, your health care provider may recommend more frequent screening and Pap tests.  Colorectal cancer can be detected and often prevented. Most routine colorectal cancer screening begins at the age of 50 years and continues through age 75 years. However, your health care provider may recommend screening at an earlier age if you have risk factors for colon cancer. On a yearly basis, your health care provider may provide home test kits to check  for hidden blood in the stool. Use of a small camera at the end of a tube, to directly examine the colon (sigmoidoscopy or colonoscopy), can detect the earliest forms of colorectal cancer. Talk to your health care provider about this at age 50, when routine screening begins. Direct exam of the colon should be repeated every 5-10 years through age 75 years, unless early forms of precancerous polyps or small growths are found.  People who are at an increased risk for hepatitis B should be screened for this virus. You are considered at high risk for hepatitis B if:  You were born in a country where hepatitis B occurs often. Talk with your health care provider about which countries are considered high risk.  Your parents were born in a high-risk country and you have not received a shot to protect against hepatitis B (hepatitis B vaccine).  You have HIV or AIDS.  You use needles to inject street drugs.  You live with, or have sex with, someone who has hepatitis B.  You get hemodialysis treatment.  You take certain medicines for conditions like cancer, organ transplantation, and autoimmune conditions.  Hepatitis C blood testing is recommended for all people born from 1945 through 1965 and any individual with known risks for hepatitis C.  Practice safe sex. Use condoms and avoid high-risk sexual practices to reduce the spread of sexually transmitted infections (STIs). STIs include gonorrhea, chlamydia, syphilis, trichomonas, herpes, HPV, and human immunodeficiency virus (HIV). Herpes, HIV, and HPV are viral illnesses that have no cure. They can result in disability, cancer, and death.  You should be screened for sexually transmitted illnesses (STIs) including gonorrhea and chlamydia if:  You are sexually active and are younger than 24 years.  You are older than 24 years and your health care provider tells you that you are at risk for this type of infection.  Your sexual activity has changed  since you were last screened and you are at an increased risk for chlamydia or gonorrhea. Ask your health care provider if you are at risk.  If you are at risk of being infected with HIV, it is recommended that you take a prescription medicine daily to prevent HIV infection. This is called preexposure prophylaxis (PrEP). You are considered at risk if:  You are sexually active and do not regularly use condoms or know the HIV status of your partner(s).  You take drugs by injection.  You are sexually active with a partner who has HIV.  Talk with your health care provider about whether you are at high risk of being infected with HIV. If   you choose to begin PrEP, you should first be tested for HIV. You should then be tested every 3 months for as long as you are taking PrEP.  Osteoporosis is a disease in which the bones lose minerals and strength with aging. This can result in serious bone fractures or breaks. The risk of osteoporosis can be identified using a bone density scan. Women ages 67 years and over and women at risk for fractures or osteoporosis should discuss screening with their health care providers. Ask your health care provider whether you should take a calcium supplement or vitamin D to reduce the rate of osteoporosis.  Menopause can be associated with physical symptoms and risks. Hormone replacement therapy is available to decrease symptoms and risks. You should talk to your health care provider about whether hormone replacement therapy is right for you.  Use sunscreen. Apply sunscreen liberally and repeatedly throughout the day. You should seek shade when your shadow is shorter than you. Protect yourself by wearing long sleeves, pants, a wide-brimmed hat, and sunglasses year round, whenever you are outdoors.  Once a month, do a whole body skin exam, using a mirror to look at the skin on your back. Tell your health care provider of new moles, moles that have irregular borders, moles that  are larger than a pencil eraser, or moles that have changed in shape or color.  Stay current with required vaccines (immunizations).  Influenza vaccine. All adults should be immunized every year.  Tetanus, diphtheria, and acellular pertussis (Td, Tdap) vaccine. Pregnant women should receive 1 dose of Tdap vaccine during each pregnancy. The dose should be obtained regardless of the length of time since the last dose. Immunization is preferred during the 27th-36th week of gestation. An adult who has not previously received Tdap or who does not know her vaccine status should receive 1 dose of Tdap. This initial dose should be followed by tetanus and diphtheria toxoids (Td) booster doses every 10 years. Adults with an unknown or incomplete history of completing a 3-dose immunization series with Td-containing vaccines should begin or complete a primary immunization series including a Tdap dose. Adults should receive a Td booster every 10 years.  Varicella vaccine. An adult without evidence of immunity to varicella should receive 2 doses or a second dose if she has previously received 1 dose. Pregnant females who do not have evidence of immunity should receive the first dose after pregnancy. This first dose should be obtained before leaving the health care facility. The second dose should be obtained 4-8 weeks after the first dose.  Human papillomavirus (HPV) vaccine. Females aged 13-26 years who have not received the vaccine previously should obtain the 3-dose series. The vaccine is not recommended for use in pregnant females. However, pregnancy testing is not needed before receiving a dose. If a female is found to be pregnant after receiving a dose, no treatment is needed. In that case, the remaining doses should be delayed until after the pregnancy. Immunization is recommended for any person with an immunocompromised condition through the age of 61 years if she did not get any or all doses earlier. During the  3-dose series, the second dose should be obtained 4-8 weeks after the first dose. The third dose should be obtained 24 weeks after the first dose and 16 weeks after the second dose.  Zoster vaccine. One dose is recommended for adults aged 30 years or older unless certain conditions are present.  Measles, mumps, and rubella (MMR) vaccine. Adults born  before 1957 generally are considered immune to measles and mumps. Adults born in 1957 or later should have 1 or more doses of MMR vaccine unless there is a contraindication to the vaccine or there is laboratory evidence of immunity to each of the three diseases. A routine second dose of MMR vaccine should be obtained at least 28 days after the first dose for students attending postsecondary schools, health care workers, or international travelers. People who received inactivated measles vaccine or an unknown type of measles vaccine during 1963-1967 should receive 2 doses of MMR vaccine. People who received inactivated mumps vaccine or an unknown type of mumps vaccine before 1979 and are at high risk for mumps infection should consider immunization with 2 doses of MMR vaccine. For females of childbearing age, rubella immunity should be determined. If there is no evidence of immunity, females who are not pregnant should be vaccinated. If there is no evidence of immunity, females who are pregnant should delay immunization until after pregnancy. Unvaccinated health care workers born before 1957 who lack laboratory evidence of measles, mumps, or rubella immunity or laboratory confirmation of disease should consider measles and mumps immunization with 2 doses of MMR vaccine or rubella immunization with 1 dose of MMR vaccine.  Pneumococcal 13-valent conjugate (PCV13) vaccine. When indicated, a person who is uncertain of his immunization history and has no record of immunization should receive the PCV13 vaccine. All adults 65 years of age and older should receive this  vaccine. An adult aged 19 years or older who has certain medical conditions and has not been previously immunized should receive 1 dose of PCV13 vaccine. This PCV13 should be followed with a dose of pneumococcal polysaccharide (PPSV23) vaccine. Adults who are at high risk for pneumococcal disease should obtain the PPSV23 vaccine at least 8 weeks after the dose of PCV13 vaccine. Adults older than 54 years of age who have normal immune system function should obtain the PPSV23 vaccine dose at least 1 year after the dose of PCV13 vaccine.  Pneumococcal polysaccharide (PPSV23) vaccine. When PCV13 is also indicated, PCV13 should be obtained first. All adults aged 65 years and older should be immunized. An adult younger than age 65 years who has certain medical conditions should be immunized. Any person who resides in a nursing home or long-term care facility should be immunized. An adult smoker should be immunized. People with an immunocompromised condition and certain other conditions should receive both PCV13 and PPSV23 vaccines. People with human immunodeficiency virus (HIV) infection should be immunized as soon as possible after diagnosis. Immunization during chemotherapy or radiation therapy should be avoided. Routine use of PPSV23 vaccine is not recommended for American Indians, [place] Natives, or people younger than 65 years unless there are medical conditions that require PPSV23 vaccine. When indicated, people who have unknown immunization and have no record of immunization should receive PPSV23 vaccine. One-time revaccination 5 years after the first dose of PPSV23 is recommended for people aged 19-64 years who have chronic kidney failure, nephrotic syndrome, asplenia, or immunocompromised conditions. People who received 1-2 doses of PPSV23 before age 65 years should receive another dose of PPSV23 vaccine at age 65 years or later if at least 5 years have passed since the previous dose. Doses of PPSV23 are not  needed for people immunized with PPSV23 at or after age 65 years.  Meningococcal vaccine. Adults with asplenia or persistent complement component deficiencies should receive 2 doses of quadrivalent meningococcal conjugate (MenACWY-D) vaccine. The doses should be obtained   at least 2 months apart. Microbiologists working with certain meningococcal bacteria, [place] recruits, people at risk during an outbreak, and people who travel to or live in countries with a high rate of meningitis should be immunized. A first-year college student up through age 34 years who is living in a residence hall should receive a dose if she did not receive a dose on or after her 16th birthday. Adults who have certain high-risk conditions should receive one or more doses of vaccine.  Hepatitis A vaccine. Adults who wish to be protected from this disease, have certain high-risk conditions, work with hepatitis A-infected animals, work in hepatitis A research labs, or travel to or work in countries with a high rate of hepatitis A should be immunized. Adults who were previously unvaccinated and who anticipate close contact with an international adoptee during the first 60 days after arrival in the [place] from a country with a high rate of hepatitis A should be immunized.  Hepatitis B vaccine. Adults who wish to be protected from this disease, have certain high-risk conditions, may be exposed to blood or other infectious body fluids, are household contacts or sex partners of hepatitis B positive people, are clients or workers in certain care facilities, or travel to or work in countries with a high rate of hepatitis B should be immunized.  Haemophilus influenzae type b (Hib) vaccine. A previously unvaccinated person with asplenia or sickle cell disease or having a scheduled splenectomy should receive 1 dose of Hib vaccine. Regardless of previous immunization, a recipient of a hematopoietic stem cell transplant should receive a  3-dose series 6-12 months after her successful transplant. Hib vaccine is not recommended for adults with HIV infection. Preventive Services / Frequency Ages 35 to 4 years  Blood pressure check.** / Every 3-5 years.  Lipid and cholesterol check.** / Every 5 years beginning at age 60.  Clinical breast exam.** / Every 3 years for women in their 71s and 10s.  BRCA-related cancer risk assessment.** / For women who have family members with a BRCA-related cancer (breast, ovarian, tubal, or peritoneal cancers).  Pap test.** / Every 2 years from ages 76 through 26. Every 3 years starting at age 61 through age 76 or 93 with a history of 3 consecutive normal Pap tests.  HPV screening.** / Every 3 years from ages 37 through ages 60 to 51 with a history of 3 consecutive normal Pap tests.  Hepatitis C blood test.** / For any individual with known risks for hepatitis C.  Skin self-exam. / Monthly.  Influenza vaccine. / Every year.  Tetanus, diphtheria, and acellular pertussis (Tdap, Td) vaccine.** / Consult your health care provider. Pregnant women should receive 1 dose of Tdap vaccine during each pregnancy. 1 dose of Td every 10 years.  Varicella vaccine.** / Consult your health care provider. Pregnant females who do not have evidence of immunity should receive the first dose after pregnancy.  HPV vaccine. / 3 doses over 6 months, if 93 and younger. The vaccine is not recommended for use in pregnant females. However, pregnancy testing is not needed before receiving a dose.  Measles, mumps, rubella (MMR) vaccine.** / You need at least 1 dose of MMR if you were born in 1957 or later. You may also need a 2nd dose. For females of childbearing age, rubella immunity should be determined. If there is no evidence of immunity, females who are not pregnant should be vaccinated. If there is no evidence of immunity, females who are  pregnant should delay immunization until after pregnancy.  Pneumococcal  13-valent conjugate (PCV13) vaccine.** / Consult your health care provider.  Pneumococcal polysaccharide (PPSV23) vaccine.** / 1 to 2 doses if you smoke cigarettes or if you have certain conditions.  Meningococcal vaccine.** / 1 dose if you are age 68 to 8 years and a Market researcher living in a residence hall, or have one of several medical conditions, you need to get vaccinated against meningococcal disease. You may also need additional booster doses.  Hepatitis A vaccine.** / Consult your health care provider.  Hepatitis B vaccine.** / Consult your health care provider.  Haemophilus influenzae type b (Hib) vaccine.** / Consult your health care provider. Ages 7 to 53 years  Blood pressure check.** / Every year.  Lipid and cholesterol check.** / Every 5 years beginning at age 25 years.  Lung cancer screening. / Every year if you are aged 11-80 years and have a 30-pack-year history of smoking and currently smoke or have quit within the past 15 years. Yearly screening is stopped once you have quit smoking for at least 15 years or develop a health problem that would prevent you from having lung cancer treatment.  Clinical breast exam.** / Every year after age 48 years.  BRCA-related cancer risk assessment.** / For women who have family members with a BRCA-related cancer (breast, ovarian, tubal, or peritoneal cancers).  Mammogram.** / Every year beginning at age 41 years and continuing for as long as you are in good health. Consult with your health care provider.  Pap test.** / Every 3 years starting at age 65 years through age 37 or 70 years with a history of 3 consecutive normal Pap tests.  HPV screening.** / Every 3 years from ages 72 years through ages 60 to 40 years with a history of 3 consecutive normal Pap tests.  Fecal occult blood test (FOBT) of stool. / Every year beginning at age 21 years and continuing until age 5 years. You may not need to do this test if you get  a colonoscopy every 10 years.  Flexible sigmoidoscopy or colonoscopy.** / Every 5 years for a flexible sigmoidoscopy or every 10 years for a colonoscopy beginning at age 35 years and continuing until age 48 years.  Hepatitis C blood test.** / For all people born from [birth year] through 1965 and any individual with known risks for hepatitis C.  Skin self-exam. / Monthly.  Influenza vaccine. / Every year.  Tetanus, diphtheria, and acellular pertussis (Tdap/Td) vaccine.** / Consult your health care provider. Pregnant women should receive 1 dose of Tdap vaccine during each pregnancy. 1 dose of Td every 10 years.  Varicella vaccine.** / Consult your health care provider. Pregnant females who do not have evidence of immunity should receive the first dose after pregnancy.  Zoster vaccine.** / 1 dose for adults aged 30 years or older.  Measles, mumps, rubella (MMR) vaccine.** / You need at least 1 dose of MMR if you were born in 1957 or later. You may also need a second dose. For females of childbearing age, rubella immunity should be determined. If there is no evidence of immunity, females who are not pregnant should be vaccinated. If there is no evidence of immunity, females who are pregnant should delay immunization until after pregnancy.  Pneumococcal 13-valent conjugate (PCV13) vaccine.** / Consult your health care provider.  Pneumococcal polysaccharide (PPSV23) vaccine.** / 1 to 2 doses if you smoke cigarettes or if you have certain conditions.  Meningococcal vaccine.** /  Consult your health care provider.  Hepatitis A vaccine.** / Consult your health care provider.  Hepatitis B vaccine.** / Consult your health care provider.  Haemophilus influenzae type b (Hib) vaccine.** / Consult your health care provider. Ages 64 years and over  Blood pressure check.** / Every year.  Lipid and cholesterol check.** / Every 5 years beginning at age 23 years.  Lung cancer screening. / Every year if you  are aged 16-80 years and have a 30-pack-year history of smoking and currently smoke or have quit within the past 15 years. Yearly screening is stopped once you have quit smoking for at least 15 years or develop a health problem that would prevent you from having lung cancer treatment.  Clinical breast exam.** / Every year after age 74 years.  BRCA-related cancer risk assessment.** / For women who have family members with a BRCA-related cancer (breast, ovarian, tubal, or peritoneal cancers).  Mammogram.** / Every year beginning at age 44 years and continuing for as long as you are in good health. Consult with your health care provider.  Pap test.** / Every 3 years starting at age 58 years through age 22 or 39 years with 3 consecutive normal Pap tests. Testing can be stopped between 65 and 70 years with 3 consecutive normal Pap tests and no abnormal Pap or HPV tests in the past 10 years.  HPV screening.** / Every 3 years from ages 64 years through ages 70 or 61 years with a history of 3 consecutive normal Pap tests. Testing can be stopped between 65 and 70 years with 3 consecutive normal Pap tests and no abnormal Pap or HPV tests in the past 10 years.  Fecal occult blood test (FOBT) of stool. / Every year beginning at age 40 years and continuing until age 27 years. You may not need to do this test if you get a colonoscopy every 10 years.  Flexible sigmoidoscopy or colonoscopy.** / Every 5 years for a flexible sigmoidoscopy or every 10 years for a colonoscopy beginning at age 7 years and continuing until age 32 years.  Hepatitis C blood test.** / For all people born from [birth year] through 1964 and any individual with known risks for hepatitis C.  Osteoporosis screening.** / A one-time screening for women ages 30 years and over and women at risk for fractures or osteoporosis.  Skin self-exam. / Monthly.  Influenza vaccine. / Every year.  Tetanus, diphtheria, and acellular pertussis (Tdap/Td)  vaccine.** / 1 dose of Td every 10 years.  Varicella vaccine.** / Consult your health care provider.  Zoster vaccine.** / 1 dose for adults aged 35 years or older.  Pneumococcal 13-valent conjugate (PCV13) vaccine.** / Consult your health care provider.  Pneumococcal polysaccharide (PPSV23) vaccine.** / 1 dose for all adults aged 46 years and older.  Meningococcal vaccine.** / Consult your health care provider.  Hepatitis A vaccine.** / Consult your health care provider.  Hepatitis B vaccine.** / Consult your health care provider.  Haemophilus influenzae type b (Hib) vaccine.** / Consult your health care provider. ** Family history and personal history of risk and conditions may change your health care provider's recommendations.   This information is not intended to replace advice given to you by your health care provider. Make sure you discuss any questions you have with your health care provider.   Document Released: 02/13/2001 Document Revised: 01/08/2014 Document Reviewed: 05/15/2010 Elsevier Interactive Patient Education [employer].

## 2015-02-10 NOTE — Progress Notes (Signed)
Jordan Juarez [id number] [birth year] 02/10/2015      Patient Progress Note   Subjective  Chief Complaint  Chief Complaint  Patient presents with  . Annual Exam    HPI  Patient presents for CPE and routine follow up. No concerns except for diminishing metabolism. Taking one tablet 6 days a week. Does not feel any difference as far as energy levels. Feels at a stand still as far as weight gain. Eating a more balanced diet and has decreased red meat. Feels that the main issue is quantity as opposed to quality. Admits hot flashes are interrupting sleep at night. Will try to lower sugar levels to smooth out hot flashes at night. Discussed spreading out meals which could help decrease total quantity. Exercising 6-7 days a week. Seeing specialist for vision, skin, teeth, no issues.  Patient denies shortness of breath, chest pain,changes in urination, GI issues, recent fevers or illnesses    Past Medical History  Diagnosis Date  . PONV (postoperative nausea and vomiting)   . Hypothyroidism   . Chicken pox as a child  . Unspecified hypothyroidism 06/03/2012  . Allergic state 06/03/2012  . Preventative health care 06/03/2012  . Thyroid nodule 06/03/2012  . Sun-damaged skin 06/03/2012  . Anemia 06/03/2012  . Breast lesion on mammography 06/03/2012    S/p biopsy on left with marker left behind, benign  . Overweight 08/08/2014    Past Surgical History  Procedure Laterality Date  . Endometrial ablation  2009    done in office- n&v postop  . Laparoscopic assisted vaginal hysterectomy  07/13/2010    Procedure: LAPAROSCOPIC ASSISTED VAGINAL HYSTERECTOMY;  Surgeon: Margarette Asal;  Location: [facility];  Service: Gynecology;  Laterality: N/A;  . Abdominal hysterectomy  07-13-10    still has ovaries  . Mole removal Right     forehead, 4th grade  . Breast surgery      left breast biopsies    Family History  Problem Relation Age of Onset  . Heart attack Mother   . Emphysema Mother     smoker  . Thyroid  disease Mother   . Hypertension Father   . Leukemia Sister     acute/ smoker  . Thyroid disease Sister   . Depression Sister     serious MVA, chronic pain  . Cancer Maternal Grandmother     lung/ smoker  . Diabetes Maternal Grandmother     type 2  . Stroke Maternal Grandfather     several  . Hypertension Maternal Grandfather   . Cancer Paternal Grandfather     bone/ smoker  . Cancer Daughter     2 abnormal skin lesion excisions at age 23 first time  . Asthma Son   . Colon cancer Neg Hx   . Rectal cancer Neg Hx   . Stomach cancer Neg Hx     Social History   Social History  . Marital Status: Married    Spouse Name: N/A  . Number of Children: N/A  . Years of Education: N/A   Occupational History  . Not on file.   Social History Main Topics  . Smoking status: Never Smoker   . Smokeless tobacco: Never Used  . Alcohol Use: 1.2 oz/week    2 Glasses of wine per week  . Drug Use: No  . Sexual Activity: Yes   Other Topics Concern  . Not on file   Social History Narrative    Current Outpatient Prescriptions on File Prior to Visit  Medication Sig Dispense Refill  . acetaminophen (TYLENOL) 500 MG tablet Take 500 mg by mouth every 6 (six) hours as needed for pain.    . cetirizine (ZYRTEC) 10 MG tablet Take 10 mg by mouth daily.    Marland Kitchen ibuprofen (ADVIL) 200 MG tablet Take 200 mg by mouth every 6 (six) hours as needed for pain.    Marland Kitchen levothyroxine (SYNTHROID, LEVOTHROID) 125 MCG tablet Take 1 by mouth daily except on saturdays take 2 by mouth (Patient taking differently: Take 6 days a week) 95 tablet 2   No current facility-administered medications on file prior to visit.    No Known Allergies  Review of Systems   Constitutional: Negative for fever and malaise/fatigue.  HENT: Negative for congestion.  Eyes: Negative for discharge.  Respiratory: Negative for shortness of breath.  Cardiovascular: Negative for chest pain, palpitations and leg swelling.   Gastrointestinal: Negative for nausea, abdominal pain and diarrhea.  Genitourinary: Negative for dysuria and urgency, hematuria and flank pain.  Musculoskeletal: Negative for myalgias and falls.  Skin: Negative for rash.  Neurological: Negative for loss of consciousness and headaches.  Endo/Heme/Allergies: Negative for polydipsia.  Psychiatric/Behavioral: Negative for depression and suicidal ideas. The patient is not nervous/anxious and does not have insomnia.    Objective  BP 110/72 mmHg  Pulse 74  Temp(Src) 98.2 F (36.8 C) (Oral)  Ht 5\' 4"  (1.626 m)  Wt 159 lb 8 oz (72.349 kg)  BMI 27.36 kg/m2  SpO2 97%  LMP 07/05/2010  Physical Exam   Constitutional: Oriented to person, place, and time. Appears well-nourished. No distress.  Eyes: EOM are normal. Pupils are equal, round, and reactive to light.  Cardiovascular: Normal rate and regular rhythm.  Pulmonary/Chest: Breath sounds normal.  Abdominal: Soft. Bowel sounds are normal.  Lymphadenopathy:   No cervical adenopathy.  Neurological: Alert and oriented to person, place, and time. Normal reflexes. No cranial nerve deficit.     Assessment & Plan  Hypothyroidism -On Levothyroxine -TSH level to see current status  Overweight -Encouraged decrease po intake and increase exercise as tolerated.  -Avoid trans fats, eat small, frequent meals every 4-5 hours with lean proteins, complex carbs and healthy fats. Minimize simple carbs, GMO foods.  Lab Work Ordered -CBC, CMP, Lipid panel, TSH Patient seen with and examined with student.  Agree with documentation See separate note for further documentation

## 2015-02-11 ENCOUNTER — Other Ambulatory Visit: Payer: Self-pay | Admitting: Family Medicine

## 2015-02-11 DIAGNOSIS — E876 Hypokalemia: Secondary | ICD-10-CM

## 2015-02-11 DIAGNOSIS — E038 Other specified hypothyroidism: Secondary | ICD-10-CM

## 2015-02-14 ENCOUNTER — Other Ambulatory Visit: Payer: 59

## 2015-02-20 ENCOUNTER — Encounter: Payer: Self-pay | Admitting: Family Medicine

## 2015-02-20 NOTE — Progress Notes (Signed)
Patient ID: Jordan Juarez, female   DOB: [birth year], 55 y.o.   MRN: [id number]   Subjective:    Patient ID: Jordan Juarez, female    DOB: [birth year], 55 y.o.   MRN: [id number]  Chief Complaint  Patient presents with  . Annual Exam    HPI Patient presents for CPE and routine follow up. No concerns except for diminishing metabolism. Taking one tablet 6 days a week. Does not feel any difference as far as energy levels. Feels at a stand still as far as weight gain. Eating a more balanced diet and has decreased red meat. Feels that the main issue is quantity as opposed to quality. Admits hot flashes are interrupting sleep at night. Will try to lower sugar levels to smooth out hot flashes at night. Discussed spreading out meals which could help decrease total quantity. Exercising 6-7 days a week. Seeing specialist for vision, skin, teeth, no issues. Denies CP/palp/SOB/HA/congestion/fevers/GI or GU c/o. Taking meds as prescribed Past Medical History  Diagnosis Date  . PONV (postoperative nausea and vomiting)   . Hypothyroidism   . Chicken pox as a child  . Unspecified hypothyroidism 06/03/2012  . Allergic state 06/03/2012  . Preventative health care 06/03/2012  . Thyroid nodule 06/03/2012  . Sun-damaged skin 06/03/2012  . Anemia 06/03/2012  . Breast lesion on mammography 06/03/2012    S/p biopsy on left with marker left behind, benign  . Overweight 08/08/2014    Past Surgical History  Procedure Laterality Date  . Endometrial ablation  2009    done in office- n&v postop  . Laparoscopic assisted vaginal hysterectomy  07/13/2010    Procedure: LAPAROSCOPIC ASSISTED VAGINAL HYSTERECTOMY;  Surgeon: Margarette Asal;  Location: [facility];  Service: Gynecology;  Laterality: N/A;  . Abdominal hysterectomy  07-13-10    still has ovaries  . Mole removal Right     forehead, 4th grade  . Breast surgery      left breast biopsies    Family History  Problem Relation Age of Onset  . Heart attack Mother   .  Emphysema Mother     smoker  . Thyroid disease Mother   . Hypertension Father   . Leukemia Sister     acute/ smoker  . Thyroid disease Sister   . Depression Sister     serious MVA, chronic pain  . Cancer Maternal Grandmother     lung/ smoker  . Diabetes Maternal Grandmother     type 2  . Stroke Maternal Grandfather     several  . Hypertension Maternal Grandfather   . Cancer Paternal Grandfather     bone/ smoker  . Cancer Daughter     2 abnormal skin lesion excisions at age 29 first time  . Asthma Son   . Colon cancer Neg Hx   . Rectal cancer Neg Hx   . Stomach cancer Neg Hx     Social History   Social History  . Marital Status: Married    Spouse Name: N/A  . Number of Children: N/A  . Years of Education: N/A   Occupational History  . Not on file.   Social History Main Topics  . Smoking status: Never Smoker   . Smokeless tobacco: Never Used  . Alcohol Use: 1.2 oz/week    2 Glasses of wine per week  . Drug Use: No  . Sexual Activity: Yes   Other Topics Concern  . Not on file   Social History Narrative    Outpatient Prescriptions  Prior to Visit  Medication Sig Dispense Refill  . acetaminophen (TYLENOL) 500 MG tablet Take 500 mg by mouth every 6 (six) hours as needed for pain.    . cetirizine (ZYRTEC) 10 MG tablet Take 10 mg by mouth daily.    Marland Kitchen ibuprofen (ADVIL) 200 MG tablet Take 200 mg by mouth every 6 (six) hours as needed for pain.    Marland Kitchen levothyroxine (SYNTHROID, LEVOTHROID) 125 MCG tablet Take 1 by mouth daily except on saturdays take 2 by mouth (Patient taking differently: Take 6 days a week) 95 tablet 2   No facility-administered medications prior to visit.    No Known Allergies  Review of Systems  Constitutional: Negative for fever, chills and malaise/fatigue.  HENT: Negative for congestion and hearing loss.   Eyes: Negative for discharge.  Respiratory: Negative for cough, sputum production and shortness of breath.   Cardiovascular: Negative for  chest pain, palpitations and leg swelling.  Gastrointestinal: Negative for heartburn, nausea, vomiting, abdominal pain, diarrhea, constipation and blood in stool.  Genitourinary: Negative for dysuria, urgency, frequency and hematuria.  Musculoskeletal: Negative for myalgias, back pain and falls.  Skin: Negative for rash.  Neurological: Negative for dizziness, sensory change, loss of consciousness, weakness and headaches.  Endo/Heme/Allergies: Negative for environmental allergies. Does not bruise/bleed easily.  Psychiatric/Behavioral: Negative for depression and suicidal ideas. The patient is not nervous/anxious and does not have insomnia.        Objective:    Physical Exam  Constitutional: She is oriented to person, place, and time. She appears well-developed and well-nourished. No distress.  HENT:  Head: Normocephalic and atraumatic.  Eyes: Conjunctivae are normal.  Neck: Neck supple. No thyromegaly present.  Cardiovascular: Normal rate, regular rhythm and normal heart sounds.   No murmur heard. Pulmonary/Chest: Effort normal and breath sounds normal. No respiratory distress.  Abdominal: Soft. Bowel sounds are normal. She exhibits no distension and no mass. There is no tenderness.  Musculoskeletal: She exhibits no edema.  Lymphadenopathy:    She has no cervical adenopathy.  Neurological: She is alert and oriented to person, place, and time.  Skin: Skin is warm and dry.  Psychiatric: She has a normal mood and affect. Her behavior is normal.    BP 110/72 mmHg  Pulse 74  Temp(Src) 98.2 F (36.8 C) (Oral)  Ht 5\' 4"  (1.626 m)  Wt 159 lb 8 oz (72.349 kg)  BMI 27.36 kg/m2  SpO2 97%  LMP 07/05/2010 Wt Readings from Last 3 Encounters:  02/10/15 159 lb 8 oz (72.349 kg)  07/29/14 156 lb 8 oz (70.988 kg)  06/11/14 158 lb 4 oz (71.782 kg)     Lab Results  Component Value Date   WBC 4.5 02/10/2015   HGB 14.4 02/10/2015   HCT 43.8 02/10/2015   PLT 217.0 02/10/2015   GLUCOSE 95  02/10/2015   CHOL 200 02/10/2015   TRIG 42.0 02/10/2015   HDL 69.60 02/10/2015   LDLCALC 122* 02/10/2015   ALT 15 02/10/2015   AST 19 02/10/2015   NA 139 02/10/2015   K 5.2* 02/10/2015   CL 103 02/10/2015   CREATININE 0.84 02/10/2015   BUN 17 02/10/2015   CO2 32 02/10/2015   TSH 0.35 02/10/2015    Lab Results  Component Value Date   TSH 0.35 02/10/2015   Lab Results  Component Value Date   WBC 4.5 02/10/2015   HGB 14.4 02/10/2015   HCT 43.8 02/10/2015   MCV 96.7 02/10/2015   PLT 217.0 02/10/2015  Lab Results  Component Value Date   NA 139 02/10/2015   K 5.2* 02/10/2015   CO2 32 02/10/2015   GLUCOSE 95 02/10/2015   BUN 17 02/10/2015   CREATININE 0.84 02/10/2015   BILITOT 0.7 02/10/2015   ALKPHOS 68 02/10/2015   AST 19 02/10/2015   ALT 15 02/10/2015   PROT 7.0 02/10/2015   ALBUMIN 4.3 02/10/2015   CALCIUM 10.1 02/10/2015   GFR 75.21 02/10/2015   Lab Results  Component Value Date   CHOL 200 02/10/2015   Lab Results  Component Value Date   HDL 69.60 02/10/2015   Lab Results  Component Value Date   LDLCALC 122* 02/10/2015   Lab Results  Component Value Date   TRIG 42.0 02/10/2015   Lab Results  Component Value Date   CHOLHDL 3 02/10/2015   No results found for: HGBA1C     Assessment & Plan:   Problem List Items Addressed This Visit    Hypothyroidism    TSH WNL with blood work today. No changes to meds      Relevant Orders   TSH (Completed)   CBC (Completed)   Lipid panel (Completed)   Comprehensive metabolic panel (Completed)   Preventative health care - Primary    Patient encouraged to maintain heart healthy diet, regular exercise, adequate sleep. Consider daily probiotics. Take medications as prescribed. Sees Dr Matthew Saras for GYN care. Labs ordered and reviewed. Discussed ACP and need to have documentation in chart. Encouraged DASH diet, decrease po intake and increase exercise as tolerated. Needs 7-8 hours of sleep nightly.        Relevant Orders   TSH (Completed)   CBC (Completed)   Lipid panel (Completed)   Comprehensive metabolic panel (Completed)    Other Visit Diagnoses    Fibrocystic breast, unspecified laterality           I am having Ms. Vanhorne maintain her ibuprofen, cetirizine, acetaminophen, and levothyroxine.  No orders of the defined types were placed in this encounter.     Penni Homans, MD

## 2015-02-20 NOTE — Assessment & Plan Note (Signed)
TSH WNL with blood work today. No changes to meds

## 2015-02-20 NOTE — Assessment & Plan Note (Signed)
Patient encouraged to maintain heart healthy diet, regular exercise, adequate sleep. Consider daily probiotics. Take medications as prescribed. Sees Dr Matthew Saras for GYN care. Labs ordered and reviewed. Discussed ACP and need to have documentation in chart. Encouraged DASH diet, decrease po intake and increase exercise as tolerated. Needs 7-8 hours of sleep nightly.

## 2015-03-16 ENCOUNTER — Ambulatory Visit
Admission: RE | Admit: 2015-03-16 | Discharge: 2015-03-16 | Disposition: A | Discharge status: Home / Self Care | Payer: 59 | Source: Ambulatory Visit | Attending: Family Medicine | Admitting: Family Medicine

## 2015-03-16 DIAGNOSIS — Z1231 Encounter for screening mammogram for malignant neoplasm of breast: Secondary | ICD-10-CM | POA: Diagnosis not present

## 2015-04-28 MED FILL — LEVOTHYROXINE 125 MCG TAB: 125 | 83 days supply | Qty: 95 | Fill #1

## 2015-05-11 ENCOUNTER — Other Ambulatory Visit (INDEPENDENT_AMBULATORY_CARE_PROVIDER_SITE_OTHER): Payer: 59

## 2015-05-11 DIAGNOSIS — E876 Hypokalemia: Secondary | ICD-10-CM

## 2015-05-11 DIAGNOSIS — E038 Other specified hypothyroidism: Secondary | ICD-10-CM | POA: Diagnosis not present

## 2015-05-11 LAB — COMPREHENSIVE METABOLIC PANEL
ALBUMIN: 4.1 g/dL (ref 3.5–5.2)
ALT: 16 U/L (ref 0–35)
AST: 19 U/L (ref 0–37)
Alkaline Phosphatase: 70 U/L (ref 39–117)
BILIRUBIN TOTAL: 0.6 mg/dL (ref 0.2–1.2)
BUN: 11 mg/dL (ref 6–23)
CALCIUM: 9.3 mg/dL (ref 8.4–10.5)
CO2: 29 mEq/L (ref 19–32)
CREATININE: 0.69 mg/dL (ref 0.40–1.20)
Chloride: 103 mEq/L (ref 96–112)
GFR: 94.29 mL/min (ref >60.00)
Glucose, Bld: 95 mg/dL (ref 70–99)
Potassium: 4 mEq/L (ref 3.5–5.1)
SODIUM: 139 meq/L (ref 135–145)
TOTAL PROTEIN: 6.5 g/dL (ref 6.0–8.3)

## 2015-05-11 LAB — TSH: TSH: 0.38 u[IU]/mL (ref 0.35–4.50)

## 2015-08-11 ENCOUNTER — Ambulatory Visit (INDEPENDENT_AMBULATORY_CARE_PROVIDER_SITE_OTHER): Payer: 59 | Admitting: Family Medicine

## 2015-08-11 ENCOUNTER — Encounter: Payer: Self-pay | Admitting: Family Medicine

## 2015-08-11 VITALS — BP 98/52 | HR 70 | Temp 97.9°F | Resp 17 | Ht 64.0 in | Wt 159.6 lb

## 2015-08-11 DIAGNOSIS — Z0000 Encounter for general adult medical examination without abnormal findings: Secondary | ICD-10-CM | POA: Diagnosis not present

## 2015-08-11 DIAGNOSIS — E663 Overweight: Secondary | ICD-10-CM

## 2015-08-11 DIAGNOSIS — E039 Hypothyroidism, unspecified: Secondary | ICD-10-CM

## 2015-08-11 DIAGNOSIS — E782 Mixed hyperlipidemia: Secondary | ICD-10-CM

## 2015-08-11 DIAGNOSIS — D649 Anemia, unspecified: Secondary | ICD-10-CM

## 2015-08-11 HISTORY — DX: Mixed hyperlipidemia: E78.2

## 2015-08-11 LAB — TSH: TSH: 0.44 u[IU]/mL (ref 0.35–4.50)

## 2015-08-11 NOTE — Assessment & Plan Note (Signed)
Encouraged DASH diet, decrease po intake and increase exercise as tolerated. Needs 7-8 hours of sleep nightly. Avoid trans fats, eat small, frequent meals every 4-5 hours with lean proteins, complex carbs and healthy fats. Minimize simple carbs 

## 2015-08-11 NOTE — Assessment & Plan Note (Signed)
New, very mild. Repeat at annual. Encouraged heart healthy diet, increase exercise, avoid trans fats

## 2015-08-11 NOTE — Patient Instructions (Signed)
The Bone Zones  Cholesterol Cholesterol is a white, waxy, fat-like substance needed by your body in small amounts. The liver makes all the cholesterol you need. Cholesterol is carried from the liver by the blood through the blood vessels. Deposits of cholesterol (plaque) may build up on blood vessel walls. These make the arteries narrower and stiffer. Cholesterol plaques increase the risk for heart attack and stroke.  You cannot feel your cholesterol level even if it is very high. The only way to know it is high is with a blood test. Once you know your cholesterol levels, you should keep a record of the test results. Work with your health care provider to keep your levels in the desired range.  WHAT DO THE RESULTS MEAN?  Total cholesterol is a rough measure of all the cholesterol in your blood.   LDL is the so-called bad cholesterol. This is the type that deposits cholesterol in the walls of the arteries. You want this level to be low.   HDL is the good cholesterol because it cleans the arteries and carries the LDL away. You want this level to be high.  Triglycerides are fat that the body can either burn for energy or store. High levels are closely linked to heart disease.  WHAT ARE THE DESIRED LEVELS OF CHOLESTEROL?  Total cholesterol below 200.   LDL below 100 for people at risk, below 70 for those at very high risk.   HDL above 50 is good, above 60 is best.   Triglycerides below 150.  HOW CAN I LOWER MY CHOLESTEROL?  Diet. Follow your diet programs as directed by your health care provider.   Choose fish or white meat chicken and [place], roasted or baked. Limit fatty cuts of red meat, fried foods, and processed meats, such as sausage and lunch meats.   Eat lots of fresh fruits and vegetables.  Choose whole grains, beans, pasta, potatoes, and cereals.   Use only small amounts of olive, corn, or canola oils.   Avoid butter, mayonnaise, shortening, or palm kernel  oils.  Avoid foods with trans fats.   Drink skim or nonfat milk and eat low-fat or nonfat yogurt and cheeses. Avoid whole milk, cream, ice cream, egg yolks, and full-fat cheeses.   Healthy desserts include angel food cake, ginger snaps, animal crackers, hard candy, popsicles, and low-fat or nonfat frozen yogurt. Avoid pastries, cakes, pies, and cookies.   Exercise. Follow your exercise programs as directed by your health care provider.   A regular program helps decrease LDL and raise HDL.   A regular program helps with weight control.   Do things that increase your activity level like gardening, walking, or taking the stairs. Ask your health care provider about how you can be more active in your daily life.   Medicine. Take medicine only as directed by your health care provider.   Medicine may be prescribed by your health care provider to help lower cholesterol and decrease the risk for heart disease.   If you have several risk factors, you may need medicine even if your levels are normal.   This information is not intended to replace advice given to you by your health care provider. Make sure you discuss any questions you have with your health care provider.   Document Released: 09/12/2000 Document Revised: 01/08/2014 Document Reviewed: 10/01/2012 Elsevier Interactive Patient Education [employer].

## 2015-08-11 NOTE — Progress Notes (Signed)
Patient ID: Jordan Juarez, female   DOB: 07/07/1960, 55 y.o.   MRN: [id number]   Subjective:    Patient ID: Jordan Juarez, female    DOB: 10/10/1960, 55 y.o.   MRN: [id number]  Chief Complaint  Patient presents with  . Hypothyroidism    6 month follow up.     HPI Patient is in today for follow up. Doing well. No recent illness or acute complaints. Exercises daily and eats a heart healthy diet. Denies CP/palp/SOB/HA/congestion/fevers/GI or GU c/o. Taking meds as prescribed  Past Medical History:  Diagnosis Date  . Allergic state 06/03/2012  . Anemia 06/03/2012  . Breast lesion on mammography 06/03/2012   S/p biopsy on left with marker left behind, benign  . Chicken pox as a child  . Hyperlipidemia, mixed 08/11/2015  . Hypothyroidism   . Overweight 08/08/2014  . PONV (postoperative nausea and vomiting)   . Preventative health care 06/03/2012  . Sun-damaged skin 06/03/2012  . Thyroid nodule 06/03/2012  . Unspecified hypothyroidism 06/03/2012    Past Surgical History:  Procedure Laterality Date  . ABDOMINAL HYSTERECTOMY  07-13-10   still has ovaries  . BREAST SURGERY     left breast biopsies  . ENDOMETRIAL ABLATION  2009   done in office- n&v postop  . LAPAROSCOPIC ASSISTED VAGINAL HYSTERECTOMY  07/13/2010   Procedure: LAPAROSCOPIC ASSISTED VAGINAL HYSTERECTOMY;  Surgeon: Margarette Asal;  Location: [facility];  Service: Gynecology;  Laterality: N/A;  . MOLE REMOVAL Right    forehead, 4th grade    Family History  Problem Relation Age of Onset  . Heart attack Mother   . Emphysema Mother     smoker  . Thyroid disease Mother   . Hypertension Father   . Leukemia Sister     acute/ smoker  . Thyroid disease Sister   . Depression Sister     serious MVA, chronic pain  . Cancer Maternal Grandmother     lung/ smoker  . Diabetes Maternal Grandmother     type 2  . Stroke Maternal Grandfather     several  . Hypertension Maternal Grandfather   . Cancer Paternal Grandfather     bone/  smoker  . Cancer Daughter     2 abnormal skin lesion excisions at age 40 first time  . Asthma Son   . Colon cancer Neg Hx   . Rectal cancer Neg Hx   . Stomach cancer Neg Hx     Social History   Social History  . Marital status: Married    Spouse name: N/A  . Number of children: N/A  . Years of education: N/A   Occupational History  . Not on file.   Social History Main Topics  . Smoking status: Never Smoker  . Smokeless tobacco: Never Used  . Alcohol use 1.2 oz/week    2 Glasses of wine per week  . Drug use: No  . Sexual activity: Yes   Other Topics Concern  . Not on file   Social History Narrative  . No narrative on file    Outpatient Medications Prior to Visit  Medication Sig Dispense Refill  . acetaminophen (TYLENOL) 500 MG tablet Take 500 mg by mouth every 6 (six) hours as needed for pain.    . cetirizine (ZYRTEC) 10 MG tablet Take 10 mg by mouth daily.    Marland Kitchen ibuprofen (ADVIL) 200 MG tablet Take 200 mg by mouth every 6 (six) hours as needed for pain.    Marland Kitchen levothyroxine (SYNTHROID,  LEVOTHROID) 125 MCG tablet Take 1 by mouth daily except on saturdays take 2 by mouth (Patient taking differently: Take 6 days a week) 95 tablet 2   No facility-administered medications prior to visit.     No Known Allergies  Review of Systems  Constitutional: Negative for malaise/fatigue.  Respiratory: Negative for cough and shortness of breath.   Gastrointestinal: Negative for nausea.  Genitourinary: Negative for dysuria and frequency.  Musculoskeletal: Negative for falls.  Skin: Negative for rash.  Neurological: Negative for dizziness and headaches.  Endo/Heme/Allergies: Negative for environmental allergies.       Objective:    Physical Exam  Constitutional: She is oriented to person, place, and time. She appears well-developed and well-nourished. No distress.  HENT:  Head: Normocephalic and atraumatic.  Nose: Nose normal.  Eyes: Right eye exhibits no discharge. Left eye  exhibits no discharge.  Neck: Normal range of motion. Neck supple.  Cardiovascular: Normal rate and regular rhythm.   No murmur heard. Pulmonary/Chest: Effort normal and breath sounds normal.  Musculoskeletal: She exhibits no edema.  Neurological: She is alert and oriented to person, place, and time.  Skin: Skin is warm and dry.  Psychiatric: She has a normal mood and affect.  Nursing note and vitals reviewed.   BP (!) 98/52 (BP Location: Right Arm, Patient Position: Sitting, Cuff Size: Large)   Pulse 70   Temp 97.9 F (36.6 C) (Oral)   Resp 17   Ht 5\' 4"  (1.626 m)   Wt 159 lb 9.6 oz (72.4 kg)   LMP 07/05/2010   SpO2 97%   BMI 27.40 kg/m  Wt Readings from Last 3 Encounters:  08/11/15 159 lb 9.6 oz (72.4 kg)  02/10/15 159 lb 8 oz (72.3 kg)  07/29/14 156 lb 8 oz (71 kg)     Lab Results  Component Value Date   WBC 4.5 02/10/2015   HGB 14.4 02/10/2015   HCT 43.8 02/10/2015   PLT 217.0 02/10/2015   GLUCOSE 95 05/11/2015   CHOL 200 02/10/2015   TRIG 42.0 02/10/2015   HDL 69.60 02/10/2015   LDLCALC 122 (H) 02/10/2015   ALT 16 05/11/2015   AST 19 05/11/2015   NA 139 05/11/2015   K 4.0 05/11/2015   CL 103 05/11/2015   CREATININE 0.69 05/11/2015   BUN 11 05/11/2015   CO2 29 05/11/2015   TSH 0.38 05/11/2015    Lab Results  Component Value Date   TSH 0.38 05/11/2015   Lab Results  Component Value Date   WBC 4.5 02/10/2015   HGB 14.4 02/10/2015   HCT 43.8 02/10/2015   MCV 96.7 02/10/2015   PLT 217.0 02/10/2015   Lab Results  Component Value Date   NA 139 05/11/2015   K 4.0 05/11/2015   CO2 29 05/11/2015   GLUCOSE 95 05/11/2015   BUN 11 05/11/2015   CREATININE 0.69 05/11/2015   BILITOT 0.6 05/11/2015   ALKPHOS 70 05/11/2015   AST 19 05/11/2015   ALT 16 05/11/2015   PROT 6.5 05/11/2015   ALBUMIN 4.1 05/11/2015   CALCIUM 9.3 05/11/2015   GFR 94.29 05/11/2015   Lab Results  Component Value Date   CHOL 200 02/10/2015   Lab Results  Component Value  Date   HDL 69.60 02/10/2015   Lab Results  Component Value Date   LDLCALC 122 (H) 02/10/2015   Lab Results  Component Value Date   TRIG 42.0 02/10/2015   Lab Results  Component Value Date   CHOLHDL 3 02/10/2015  No results found for: HGBA1C     Assessment & Plan:   Problem List Items Addressed This Visit    Hypothyroidism    On Levothyroxine, continue to monitor.      Relevant Orders   TSH   TSH   Comprehensive metabolic panel   CBC   Preventative health care - Primary   Relevant Orders   TSH   Lipid panel   Comprehensive metabolic panel   CBC   Anemia   Overweight    Encouraged DASH diet, decrease po intake and increase exercise as tolerated. Needs 7-8 hours of sleep nightly. Avoid trans fats, eat small, frequent meals every 4-5 hours with lean proteins, complex carbs and healthy fats. Minimize simple carbs.       Hyperlipidemia, mixed    New, very mild. Repeat at annual. Encouraged heart healthy diet, increase exercise, avoid trans fats      Relevant Orders   Lipid panel    Other Visit Diagnoses   None.     I am having Ms. Titsworth maintain her ibuprofen, cetirizine, acetaminophen, and levothyroxine.  No orders of the defined types were placed in this encounter.    Penni Homans, MD

## 2015-08-11 NOTE — Assessment & Plan Note (Signed)
On Levothyroxine, continue to monitor 

## 2015-08-11 NOTE — Progress Notes (Signed)
Pre visit review using our clinic review tool, if applicable. No additional management support is needed unless otherwise documented below in the visit note. 

## 2015-08-16 ENCOUNTER — Other Ambulatory Visit: Payer: Self-pay | Admitting: Family Medicine

## 2015-08-16 DIAGNOSIS — E039 Hypothyroidism, unspecified: Secondary | ICD-10-CM

## 2015-08-16 MED FILL — LEVOTHYROXINE 125 MCG TAB: 125 | 83 days supply | Qty: 95 | Fill #0

## 2015-09-27 DIAGNOSIS — H524 Presbyopia: Secondary | ICD-10-CM | POA: Diagnosis not present

## 2015-12-12 MED FILL — LEVOTHYROXINE 125 MCG TAB: 125 | 83 days supply | Qty: 95 | Fill #1

## 2016-02-08 ENCOUNTER — Other Ambulatory Visit (INDEPENDENT_AMBULATORY_CARE_PROVIDER_SITE_OTHER): Payer: 59

## 2016-02-08 DIAGNOSIS — Z0000 Encounter for general adult medical examination without abnormal findings: Secondary | ICD-10-CM

## 2016-02-08 DIAGNOSIS — E782 Mixed hyperlipidemia: Secondary | ICD-10-CM

## 2016-02-08 DIAGNOSIS — E039 Hypothyroidism, unspecified: Secondary | ICD-10-CM | POA: Diagnosis not present

## 2016-02-08 LAB — CBC
HCT: 43.1 % (ref 36.0–46.0)
Hemoglobin: 14.4 g/dL (ref 12.0–15.0)
MCHC: 33.3 g/dL (ref 30.0–36.0)
MCV: 95.5 fl (ref 78.0–100.0)
Platelets: 244 10*3/uL (ref 150.0–400.0)
RBC: 4.52 Mil/uL (ref 3.87–5.11)
RDW: 13.3 % (ref 11.5–15.5)
WBC: 4.7 10*3/uL (ref 4.0–10.5)

## 2016-02-08 LAB — COMPREHENSIVE METABOLIC PANEL
ALBUMIN: 4.5 g/dL (ref 3.5–5.2)
ALK PHOS: 75 U/L (ref 39–117)
ALT: 15 U/L (ref 0–35)
AST: 17 U/L (ref 0–37)
BILIRUBIN TOTAL: 0.6 mg/dL (ref 0.2–1.2)
BUN: 13 mg/dL (ref 6–23)
CO2: 31 mEq/L (ref 19–32)
CREATININE: 0.73 mg/dL (ref 0.40–1.20)
Calcium: 9.6 mg/dL (ref 8.4–10.5)
Chloride: 102 mEq/L (ref 96–112)
GFR: 88.11 mL/min (ref >60.00)
GLUCOSE: 94 mg/dL (ref 70–99)
Potassium: 3.7 mEq/L (ref 3.5–5.1)
SODIUM: 140 meq/L (ref 135–145)
TOTAL PROTEIN: 7.1 g/dL (ref 6.0–8.3)

## 2016-02-08 LAB — LIPID PANEL
CHOLESTEROL: 199 mg/dL (ref 0–200)
HDL: 68.8 mg/dL (ref >39.00)
LDL Cholesterol: 123 mg/dL — ABNORMAL HIGH (ref 0–99)
NONHDL: 130.36
Total CHOL/HDL Ratio: 3
Triglycerides: 39 mg/dL (ref 0.0–149.0)
VLDL: 7.8 mg/dL (ref 0.0–40.0)

## 2016-02-08 LAB — TSH: TSH: 0.18 u[IU]/mL — AB (ref 0.35–4.50)

## 2016-02-13 ENCOUNTER — Ambulatory Visit (INDEPENDENT_AMBULATORY_CARE_PROVIDER_SITE_OTHER): Payer: 59 | Admitting: Family Medicine

## 2016-02-13 ENCOUNTER — Encounter: Payer: Self-pay | Admitting: Family Medicine

## 2016-02-13 DIAGNOSIS — Z0000 Encounter for general adult medical examination without abnormal findings: Secondary | ICD-10-CM

## 2016-02-13 DIAGNOSIS — E039 Hypothyroidism, unspecified: Secondary | ICD-10-CM

## 2016-02-13 MED ORDER — ACYCLOVIR 400 MG PO TABS: 400.0000 mg | ORAL_TABLET | Freq: Three times a day (TID) | ORAL | 2 refills | Status: DC

## 2016-02-13 MED ORDER — ACYCLOVIR 5 % EX OINT: 1.0000 "application " | TOPICAL_OINTMENT | CUTANEOUS | 1 refills | Status: DC

## 2016-02-13 MED ORDER — LEVOTHYROXINE SODIUM 125 MCG PO TABS: ORAL_TABLET | ORAL | 2 refills | Status: DC

## 2016-02-13 MED FILL — ACYCLOVIR 400 MG TABLET: 400 | 5 days supply | Qty: 15 | Fill #0

## 2016-02-13 NOTE — Assessment & Plan Note (Signed)
Patient encouraged to maintain heart healthy diet, regular exercise, adequate sleep. Consider daily probiotics. Take medications as prescribed 

## 2016-02-13 NOTE — Progress Notes (Signed)
Patient ID: Jordan Juarez, female   DOB: December 06, 1960, 56 y.o.   MRN: [id number]   Subjective:    Patient ID: Jordan Juarez, female    DOB: August 09, 1960, 56 y.o.   MRN: [id number]  Chief Complaint  Patient presents with  . Annual Exam  . Hyperlipidemia   I acted as a Education administrator for Dr. Charlett Blake. Leanore Biggers, RMA  Hyperlipidemia  This is a chronic problem. The problem is controlled. Pertinent negatives include no chest pain or shortness of breath.    Patient is in today for an annual exam. She is feeling well today. No recent febrile illness or hospitalization. She struggles with the stress of her now adult children moving as far away as [place] but fortunately they are doing well. Has some frustrations with weight gain and fatigue but nothting debilitating. Denies CP/palp/SOB/HA/congestion/fevers/GI or GU c/o. Taking meds as prescribed. Exercises regularly and tries to maintain a heart healthy diet.   Past Medical History:  Diagnosis Date  . Allergic state 06/03/2012  . Anemia 06/03/2012  . Breast lesion on mammography 06/03/2012   S/p biopsy on left with marker left behind, benign  . Chicken pox as a child  . Hyperlipidemia, mixed 08/11/2015  . Hypothyroidism   . Overweight 08/08/2014  . PONV (postoperative nausea and vomiting)   . Preventative health care 06/03/2012  . Sun-damaged skin 06/03/2012  . Thyroid nodule 06/03/2012  . Unspecified hypothyroidism 06/03/2012    Past Surgical History:  Procedure Laterality Date  . ABDOMINAL HYSTERECTOMY  07-13-10   still has ovaries  . BREAST SURGERY     left breast biopsies  . ENDOMETRIAL ABLATION  2009   done in office- n&v postop  . LAPAROSCOPIC ASSISTED VAGINAL HYSTERECTOMY  07/13/2010   Procedure: LAPAROSCOPIC ASSISTED VAGINAL HYSTERECTOMY;  Surgeon: Margarette Asal;  Location: [facility];  Service: Gynecology;  Laterality: N/A;  . MOLE REMOVAL Right    forehead, 4th grade    Family History  Problem Relation Age of Onset  . Heart attack Mother   .  Emphysema Mother     smoker  . Thyroid disease Mother   . Hypertension Father   . Leukemia Sister     acute/ smoker  . Thyroid disease Sister   . Depression Sister     serious MVA, chronic pain  . Cancer Maternal Grandmother     lung/ smoker  . Diabetes Maternal Grandmother     type 2  . Stroke Maternal Grandfather     several  . Hypertension Maternal Grandfather   . Cancer Paternal Grandfather     bone/ smoker  . Cancer Daughter     2 abnormal skin lesion excisions at age 71 first time  . Asthma Son   . Colon cancer Neg Hx   . Rectal cancer Neg Hx   . Stomach cancer Neg Hx     Social History   Social History  . Marital status: Married    Spouse name: N/A  . Number of children: N/A  . Years of education: N/A   Occupational History  . Not on file.   Social History Main Topics  . Smoking status: Never Smoker  . Smokeless tobacco: Never Used  . Alcohol use 1.2 oz/week    2 Glasses of wine per week  . Drug use: No  . Sexual activity: Yes   Other Topics Concern  . Not on file   Social History Narrative  . No narrative on file    Outpatient Medications Prior to  Visit  Medication Sig Dispense Refill  . acetaminophen (TYLENOL) 500 MG tablet Take 500 mg by mouth every 6 (six) hours as needed for pain.    . cetirizine (ZYRTEC) 10 MG tablet Take 10 mg by mouth daily.    Marland Kitchen ibuprofen (ADVIL) 200 MG tablet Take 200 mg by mouth every 6 (six) hours as needed for pain.    Marland Kitchen levothyroxine (SYNTHROID, LEVOTHROID) 125 MCG tablet TAKE 1 BY MOUTH DAILY EXCEPT ON SATURDAYS TAKE 2 BY MOUTH 95 tablet 2   No facility-administered medications prior to visit.     No Known Allergies  Review of Systems  Constitutional: Positive for malaise/fatigue. Negative for fever.  HENT: Negative for congestion.   Eyes: Negative for blurred vision.  Respiratory: Negative for cough and shortness of breath.   Cardiovascular: Negative for chest pain, palpitations and leg swelling.    Gastrointestinal: Negative for vomiting.  Musculoskeletal: Negative for back pain.  Skin: Negative for rash.  Neurological: Negative for loss of consciousness and headaches.       Objective:    Physical Exam  Constitutional: She is oriented to person, place, and time. She appears well-developed and well-nourished. No distress.  HENT:  Head: Normocephalic and atraumatic.  Eyes: Conjunctivae are normal.  Neck: Normal range of motion. No thyromegaly present.  Cardiovascular: Normal rate and regular rhythm.   Pulmonary/Chest: Effort normal and breath sounds normal. She has no wheezes.  Abdominal: Soft. Bowel sounds are normal. There is no tenderness.  Musculoskeletal: Normal range of motion. She exhibits no edema or deformity.  Neurological: She is alert and oriented to person, place, and time.  Skin: Skin is warm and dry. She is not diaphoretic.  Psychiatric: She has a normal mood and affect.    BP 114/66 (BP Location: Left Arm, Patient Position: Sitting, Cuff Size: Normal)   Pulse 65   Temp 98.4 F (36.9 C) (Oral)   Wt 163 lb (73.9 kg)   LMP 07/05/2010   SpO2 97%   BMI 27.98 kg/m  Wt Readings from Last 3 Encounters:  02/13/16 163 lb (73.9 kg)  08/11/15 159 lb 9.6 oz (72.4 kg)  02/10/15 159 lb 8 oz (72.3 kg)     Lab Results  Component Value Date   WBC 4.7 02/08/2016   HGB 14.4 02/08/2016   HCT 43.1 02/08/2016   PLT 244.0 02/08/2016   GLUCOSE 94 02/08/2016   CHOL 199 02/08/2016   TRIG 39.0 02/08/2016   HDL 68.80 02/08/2016   LDLCALC 123 (H) 02/08/2016   ALT 15 02/08/2016   AST 17 02/08/2016   NA 140 02/08/2016   K 3.7 02/08/2016   CL 102 02/08/2016   CREATININE 0.73 02/08/2016   BUN 13 02/08/2016   CO2 31 02/08/2016   TSH 0.18 (L) 02/08/2016    Lab Results  Component Value Date   TSH 0.18 (L) 02/08/2016   Lab Results  Component Value Date   WBC 4.7 02/08/2016   HGB 14.4 02/08/2016   HCT 43.1 02/08/2016   MCV 95.5 02/08/2016   PLT 244.0 02/08/2016    Lab Results  Component Value Date   NA 140 02/08/2016   K 3.7 02/08/2016   CO2 31 02/08/2016   GLUCOSE 94 02/08/2016   BUN 13 02/08/2016   CREATININE 0.73 02/08/2016   BILITOT 0.6 02/08/2016   ALKPHOS 75 02/08/2016   AST 17 02/08/2016   ALT 15 02/08/2016   PROT 7.1 02/08/2016   ALBUMIN 4.5 02/08/2016   CALCIUM 9.6 02/08/2016  GFR 88.11 02/08/2016   Lab Results  Component Value Date   CHOL 199 02/08/2016   Lab Results  Component Value Date   HDL 68.80 02/08/2016   Lab Results  Component Value Date   LDLCALC 123 (H) 02/08/2016   Lab Results  Component Value Date   TRIG 39.0 02/08/2016   Lab Results  Component Value Date   CHOLHDL 3 02/08/2016   No results found for: HGBA1C     Assessment & Plan:   Problem List Items Addressed This Visit    None      I am having Ms. Williams maintain her ibuprofen, cetirizine, acetaminophen, and levothyroxine.  No orders of the defined types were placed in this encounter.   CMA served as Education administrator during this visit. History, Physical and Plan performed by medical provider. Documentation and orders reviewed and attested to.  [name], [place]

## 2016-02-13 NOTE — Progress Notes (Signed)
Pre visit review using our clinic review tool, if applicable. No additional management support is needed unless otherwise documented below in the visit note. 

## 2016-02-13 NOTE — Patient Instructions (Addendum)
MIND diet  Preventive Care 40-64 Years, Female Preventive care refers to lifestyle choices and visits with your health care provider that can promote health and wellness. What does preventive care include?  A yearly physical exam. This is also called an annual well check.  Dental exams once or twice a year.  Routine eye exams. Ask your health care provider how often you should have your eyes checked.  Personal lifestyle choices, including:  Daily care of your teeth and gums.  Regular physical activity.  Eating a healthy diet.  Avoiding tobacco and drug use.  Limiting alcohol use.  Practicing safe sex.  Taking low-dose aspirin daily starting at age 53.  Taking vitamin and mineral supplements as recommended by your health care provider. What happens during an annual well check? The services and screenings done by your health care provider during your annual well check will depend on your age, overall health, lifestyle risk factors, and family history of disease. Counseling  Your health care provider may ask you questions about your:  Alcohol use.  Tobacco use.  Drug use.  Emotional well-being.  Home and relationship well-being.  Sexual activity.  Eating habits.  Work and work Statistician.  Method of birth control.  Menstrual cycle.  Pregnancy history. Screening  You may have the following tests or measurements:  Height, weight, and BMI.  Blood pressure.  Lipid and cholesterol levels. These may be checked every 5 years, or more frequently if you are over 35 years old.  Skin check.  Lung cancer screening. You may have this screening every year starting at age 34 if you have a 30-pack-year history of smoking and currently smoke or have quit within the past 15 years.  Fecal occult blood test (FOBT) of the stool. You may have this test every year starting at age 60.  Flexible sigmoidoscopy or colonoscopy. You may have a sigmoidoscopy every 5 years or a  colonoscopy every 10 years starting at age 50.  Hepatitis C blood test.  Hepatitis B blood test.  Sexually transmitted disease (STD) testing.  Diabetes screening. This is done by checking your blood sugar (glucose) after you have not eaten for a while (fasting). You may have this done every 1-3 years.  Mammogram. This may be done every 1-2 years. Talk to your health care provider about when you should start having regular mammograms. This may depend on whether you have a family history of breast cancer.  BRCA-related cancer screening. This may be done if you have a family history of breast, ovarian, tubal, or peritoneal cancers.  Pelvic exam and Pap test. This may be done every 3 years starting at age 32. Starting at age 44, this may be done every 5 years if you have a Pap test in combination with an HPV test.  Bone density scan. This is done to screen for osteoporosis. You may have this scan if you are at high risk for osteoporosis. Discuss your test results, treatment options, and if necessary, the need for more tests with your health care provider. Vaccines  Your health care provider may recommend certain vaccines, such as:  Influenza vaccine. This is recommended every year.  Tetanus, diphtheria, and acellular pertussis (Tdap, Td) vaccine. You may need a Td booster every 10 years.  Varicella vaccine. You may need this if you have not been vaccinated.  Zoster vaccine. You may need this after age 58.  Measles, mumps, and rubella (MMR) vaccine. You may need at least one dose of MMR if  you were born in [birth year] or later. You may also need a second dose.  Pneumococcal 13-valent conjugate (PCV13) vaccine. You may need this if you have certain conditions and were not previously vaccinated.  Pneumococcal polysaccharide (PPSV23) vaccine. You may need one or two doses if you smoke cigarettes or if you have certain conditions.  Meningococcal vaccine. You may need this if you have certain  conditions.  Hepatitis A vaccine. You may need this if you have certain conditions or if you travel or work in places where you may be exposed to hepatitis A.  Hepatitis B vaccine. You may need this if you have certain conditions or if you travel or work in places where you may be exposed to hepatitis B.  Haemophilus influenzae type b (Hib) vaccine. You may need this if you have certain conditions. Talk to your health care provider about which screenings and vaccines you need and how often you need them. This information is not intended to replace advice given to you by your health care provider. Make sure you discuss any questions you have with your health care provider. Document Released: 01/14/2015 Document Revised: 09/07/2015 Document Reviewed: 10/19/2014 Elsevier Interactive Patient Education  2017 [employer].

## 2016-02-13 NOTE — Assessment & Plan Note (Signed)
TSH mildly suppressed. Will recheck TSH in 8 weeks. Will consider changing Levothyroxine.

## 2016-02-20 ENCOUNTER — Other Ambulatory Visit: Payer: Self-pay | Admitting: Family Medicine

## 2016-02-20 DIAGNOSIS — Z1231 Encounter for screening mammogram for malignant neoplasm of breast: Secondary | ICD-10-CM

## 2016-03-16 ENCOUNTER — Ambulatory Visit
Admission: RE | Admit: 2016-03-16 | Discharge: 2016-03-16 | Disposition: A | Discharge status: Home / Self Care | Payer: 59 | Source: Ambulatory Visit | Attending: Family Medicine | Admitting: Family Medicine

## 2016-03-16 DIAGNOSIS — Z1231 Encounter for screening mammogram for malignant neoplasm of breast: Secondary | ICD-10-CM

## 2016-03-27 MED FILL — ACYCLOVIR 400 MG TABLET: 400 | 5 days supply | Qty: 15 | Fill #1

## 2016-03-27 MED FILL — LEVOTHYROXINE 125 MCG TAB: 125 | 83 days supply | Qty: 95 | Fill #2

## 2016-04-09 ENCOUNTER — Other Ambulatory Visit (INDEPENDENT_AMBULATORY_CARE_PROVIDER_SITE_OTHER): Payer: 59

## 2016-04-09 ENCOUNTER — Other Ambulatory Visit: Payer: Self-pay | Admitting: Family Medicine

## 2016-04-09 DIAGNOSIS — E039 Hypothyroidism, unspecified: Secondary | ICD-10-CM

## 2016-04-09 LAB — TSH: TSH: 0.08 u[IU]/mL — ABNORMAL LOW (ref 0.35–4.50)

## 2016-04-09 MED ORDER — LEVOTHYROXINE SODIUM 112 MCG PO TABS: 112.0000 ug | ORAL_TABLET | Freq: Every day | ORAL | 0 refills | Status: DC

## 2016-04-09 MED FILL — LEVOTHYROXINE 112 MCG TAB: 112 | 90 days supply | Qty: 90 | Fill #0

## 2016-05-08 ENCOUNTER — Telehealth: Payer: Self-pay | Admitting: Family Medicine

## 2016-05-08 MED ORDER — SCOPOLAMINE 1 MG/3DAYS TD PT72: 1.0000 | MEDICATED_PATCH | TRANSDERMAL | 1 refills | Status: DC | PRN

## 2016-05-08 NOTE — Telephone Encounter (Signed)
Patient has been referred to health dept. For vaccines.   pc

## 2016-05-08 NOTE — Telephone Encounter (Signed)
Pt says that she will be traveling out of the country to [place] and need 2 vaccinations.   1. typhoid vaccine 2. Mel ana   Should these be scheduled with PCP or nurse?    ALSO, Pt says that she need Scalpel for nausea.    Please advise

## 2016-05-09 MED FILL — TRANSDERM-SCOP 1.5 MG/72HR: 1 | 15 days supply | Qty: 5 | Fill #0

## 2016-05-14 ENCOUNTER — Encounter: Payer: Self-pay | Admitting: Family Medicine

## 2016-05-15 ENCOUNTER — Other Ambulatory Visit: Payer: Self-pay | Admitting: Family Medicine

## 2016-05-15 MED ORDER — TYPHOID VACCINE PO CPDR: 1.0000 | DELAYED_RELEASE_CAPSULE | ORAL | 0 refills | Status: DC

## 2016-05-15 MED ORDER — ATOVAQUONE-PROGUANIL HCL 250-100 MG PO TABS: 1.0000 | ORAL_TABLET | Freq: Every day | ORAL | 0 refills | Status: DC

## 2016-07-09 ENCOUNTER — Other Ambulatory Visit (INDEPENDENT_AMBULATORY_CARE_PROVIDER_SITE_OTHER): Payer: 59

## 2016-07-09 ENCOUNTER — Other Ambulatory Visit: Payer: Self-pay | Admitting: Family Medicine

## 2016-07-09 DIAGNOSIS — E039 Hypothyroidism, unspecified: Secondary | ICD-10-CM | POA: Diagnosis not present

## 2016-07-09 LAB — TSH: TSH: 0.15 u[IU]/mL — AB (ref 0.35–4.50)

## 2016-07-09 MED ORDER — LEVOTHYROXINE SODIUM 112 MCG PO TABS: 112.0000 ug | ORAL_TABLET | Freq: Every day | ORAL | 1 refills | Status: DC

## 2016-07-09 NOTE — Telephone Encounter (Signed)
Relation to pt: self  Call back [phone] Pharmacy: [facility] [place], [place] - [address] [place] [address] [phone] (Phone) [phone] (Fax)     Reason for call:  Patient was seen today for labs only ( TSH) and will run out of levothyroxine (SYNTHROID, LEVOTHROID) 112 MCG tablet today, requesting a refill

## 2016-07-09 NOTE — Telephone Encounter (Signed)
Lab results reported and she will stay on current dose Sent in refill to [facility] in [place].

## 2016-07-09 NOTE — Telephone Encounter (Signed)
Will send it let me know once results are completed if staying on the same strength

## 2016-07-09 NOTE — Telephone Encounter (Signed)
Continue current dose.

## 2016-09-27 DIAGNOSIS — H16143 Punctate keratitis, bilateral: Secondary | ICD-10-CM | POA: Diagnosis not present

## 2016-11-07 DIAGNOSIS — H524 Presbyopia: Secondary | ICD-10-CM | POA: Diagnosis not present

## 2017-01-10 ENCOUNTER — Other Ambulatory Visit: Payer: Self-pay | Admitting: Family Medicine

## 2017-01-17 ENCOUNTER — Other Ambulatory Visit: Payer: Self-pay | Admitting: Family Medicine

## 2017-01-17 ENCOUNTER — Encounter: Payer: Self-pay | Admitting: Family Medicine

## 2017-01-17 MED ORDER — ATOVAQUONE-PROGUANIL HCL 250-100 MG PO TABS: 1.0000 | ORAL_TABLET | Freq: Every day | ORAL | 0 refills | Status: DC

## 2017-01-18 MED FILL — ATOVAQUONE-PROGUANIL 250-10: 250-100 | 40 days supply | Qty: 40 | Fill #0

## 2017-03-11 ENCOUNTER — Ambulatory Visit (INDEPENDENT_AMBULATORY_CARE_PROVIDER_SITE_OTHER): Payer: 59 | Admitting: Family Medicine

## 2017-03-11 ENCOUNTER — Other Ambulatory Visit: Payer: Self-pay | Admitting: Family Medicine

## 2017-03-11 ENCOUNTER — Encounter: Payer: Self-pay | Admitting: Family Medicine

## 2017-03-11 VITALS — BP 98/58 | HR 74 | Temp 98.4°F | Resp 18 | Ht 64.0 in | Wt 145.8 lb

## 2017-03-11 DIAGNOSIS — E039 Hypothyroidism, unspecified: Secondary | ICD-10-CM | POA: Diagnosis not present

## 2017-03-11 DIAGNOSIS — Z0000 Encounter for general adult medical examination without abnormal findings: Secondary | ICD-10-CM

## 2017-03-11 DIAGNOSIS — E782 Mixed hyperlipidemia: Secondary | ICD-10-CM | POA: Diagnosis not present

## 2017-03-11 DIAGNOSIS — Z1239 Encounter for other screening for malignant neoplasm of breast: Secondary | ICD-10-CM

## 2017-03-11 DIAGNOSIS — E663 Overweight: Secondary | ICD-10-CM

## 2017-03-11 DIAGNOSIS — Z1231 Encounter for screening mammogram for malignant neoplasm of breast: Secondary | ICD-10-CM

## 2017-03-11 LAB — CBC
HEMATOCRIT: 41.7 % (ref 36.0–46.0)
HEMOGLOBIN: 14.1 g/dL (ref 12.0–15.0)
MCHC: 33.7 g/dL (ref 30.0–36.0)
MCV: 95.2 fl (ref 78.0–100.0)
Platelets: 188 10*3/uL (ref 150.0–400.0)
RBC: 4.38 Mil/uL (ref 3.87–5.11)
RDW: 13.1 % (ref 11.5–15.5)
WBC: 3.3 10*3/uL — AB (ref 4.0–10.5)

## 2017-03-11 LAB — COMPREHENSIVE METABOLIC PANEL
ALBUMIN: 4.3 g/dL (ref 3.5–5.2)
ALT: 13 U/L (ref 0–35)
AST: 18 U/L (ref 0–37)
Alkaline Phosphatase: 69 U/L (ref 39–117)
BILIRUBIN TOTAL: 0.7 mg/dL (ref 0.2–1.2)
BUN: 11 mg/dL (ref 6–23)
CALCIUM: 10.2 mg/dL (ref 8.4–10.5)
CHLORIDE: 105 meq/L (ref 96–112)
CO2: 31 mEq/L (ref 19–32)
Creatinine, Ser: 0.68 mg/dL (ref 0.40–1.20)
GFR: 95.25 mL/min (ref >60.00)
Glucose, Bld: 89 mg/dL (ref 70–99)
POTASSIUM: 5.1 meq/L (ref 3.5–5.1)
SODIUM: 142 meq/L (ref 135–145)
TOTAL PROTEIN: 7 g/dL (ref 6.0–8.3)

## 2017-03-11 LAB — LIPID PANEL
CHOL/HDL RATIO: 3
Cholesterol: 205 mg/dL — ABNORMAL HIGH (ref 0–200)
HDL: 71.5 mg/dL (ref >39.00)
LDL Cholesterol: 125 mg/dL — ABNORMAL HIGH (ref 0–99)
NONHDL: 133.34
Triglycerides: 43 mg/dL (ref 0.0–149.0)
VLDL: 8.6 mg/dL (ref 0.0–40.0)

## 2017-03-11 NOTE — Assessment & Plan Note (Signed)
On Levothyroxine, continue to monitor 

## 2017-03-11 NOTE — Assessment & Plan Note (Signed)
Encouraged heart healthy diet, increase exercise, avoid trans fats, consider a krill oil cap daily 

## 2017-03-11 NOTE — Assessment & Plan Note (Signed)
Encouraged DASH diet, decrease po intake and increase exercise as tolerated. Needs 7-8 hours of sleep nightly. Avoid trans fats, eat small, frequent meals every 4-5 hours with lean proteins, complex carbs and healthy fats. Minimize simple carbs, great weight loss with using fitness pal and she is eating well and exercising.

## 2017-03-11 NOTE — Assessment & Plan Note (Addendum)
  Patient encouraged to maintain heart healthy diet, regular exercise, adequate sleep. Consider daily probiotics. Take medications as prescribed. Labs ordered today

## 2017-03-11 NOTE — Progress Notes (Signed)
Subjective:  I acted as a Education administrator for Dr. Charlett Blake. [name], [place]  Patient ID: Jordan Juarez, female    DOB: [birth year], 57 y.o.   MRN: [id number]  No chief complaint on file.   HPI  Patient is in today for an annual exam and follow up on chronic medical concerns including hypothyroidism and hyperlipidemia. She has been exercising regularly and eating a heart healthy diet so has lost a good amount of weight since her last visit. Denies CP/palp/SOB/HA/congestion/fevers/GI or GU c/o. Taking meds as prescribed. Doing well with activities of daily living. They have just had their first grandchild and everyone is doing well.  Patient Care Team: Mosie Lukes, MD as PCP - General (Family Medicine)   Past Medical History:  Diagnosis Date  . Allergic state 06/03/2012  . Anemia 06/03/2012  . Breast lesion on mammography 06/03/2012   S/p biopsy on left with marker left behind, benign  . Chicken pox as a child  . Hyperlipidemia, mixed 08/11/2015  . Hypothyroidism   . Overweight 08/08/2014  . PONV (postoperative nausea and vomiting)   . Preventative health care 06/03/2012  . Sun-damaged skin 06/03/2012  . Thyroid nodule 06/03/2012  . Unspecified hypothyroidism 06/03/2012    Past Surgical History:  Procedure Laterality Date  . ABDOMINAL HYSTERECTOMY  07-13-10   still has ovaries  . BREAST BIOPSY  2011  . BREAST SURGERY     left breast biopsies  . ENDOMETRIAL ABLATION  2009   done in office- n&v postop  . LAPAROSCOPIC ASSISTED VAGINAL HYSTERECTOMY  07/13/2010   Procedure: LAPAROSCOPIC ASSISTED VAGINAL HYSTERECTOMY;  Surgeon: Margarette Asal;  Location: [facility];  Service: Gynecology;  Laterality: N/A;  . MOLE REMOVAL Right    forehead, 4th grade    Family History  Problem Relation Age of Onset  . Heart attack Mother   . Emphysema Mother        smoker  . Thyroid disease Mother   . Hypertension Father   . Leukemia Sister        acute/ smoker  . Thyroid disease Sister   . Depression Sister       serious MVA, chronic pain  . Cancer Maternal Grandmother        lung/ smoker  . Diabetes Maternal Grandmother        type 2  . Stroke Maternal Grandfather        several  . Hypertension Maternal Grandfather   . Cancer Paternal Grandfather        bone/ smoker  . Cancer Daughter        2 abnormal skin lesion excisions at age 63 first time  . Asthma Son   . Colon cancer Neg Hx   . Rectal cancer Neg Hx   . Stomach cancer Neg Hx     Social History   Socioeconomic History  . Marital status: Married    Spouse name: Not on file  . Number of children: Not on file  . Years of education: Not on file  . Highest education level: Not on file  Social Needs  . Financial resource strain: Not on file  . Food insecurity - worry: Not on file  . Food insecurity - inability: Not on file  . Transportation needs - medical: Not on file  . Transportation needs - non-medical: Not on file  Occupational History  . Not on file  Tobacco Use  . Smoking status: Never Smoker  . Smokeless tobacco: Never Used  Substance and  Sexual Activity  . Alcohol use: Yes    Alcohol/week: 1.2 oz    Types: 2 Glasses of wine per week  . Drug use: No  . Sexual activity: Yes  Other Topics Concern  . Not on file  Social History Narrative  . Not on file    Outpatient Medications Prior to Visit  Medication Sig Dispense Refill  . acetaminophen (TYLENOL) 500 MG tablet Take 500 mg by mouth every 6 (six) hours as needed for pain.    Marland Kitchen acyclovir (ZOVIRAX) 400 MG tablet Take 1 tablet (400 mg total) by mouth 3 (three) times daily. 15 tablet 2  . atovaquone-proguanil (MALARONE) 250-100 MG TABS tablet Take 1 tablet by mouth daily. 40 tablet 0  . cetirizine (ZYRTEC) 10 MG tablet Take 10 mg by mouth daily.    Marland Kitchen ibuprofen (ADVIL) 200 MG tablet Take 200 mg by mouth every 6 (six) hours as needed for pain.    Marland Kitchen levothyroxine (SYNTHROID, LEVOTHROID) 112 MCG tablet TAKE 1 TABLET(112 MCG) BY MOUTH DAILY 90 tablet 0  . scopolamine  (TRANSDERM-SCOP, 1.5 MG,) 1 MG/3DAYS Place 1 patch (1.5 mg total) onto the skin as needed. 10 patch 1  . typhoid (VIVOTIF) DR capsule Take 1 capsule by mouth every other day. 4 capsule 0   No facility-administered medications prior to visit.     No Known Allergies  Review of Systems  Constitutional: Negative for fever and malaise/fatigue.  HENT: Negative for congestion.   Eyes: Negative for blurred vision.  Respiratory: Negative for shortness of breath.   Cardiovascular: Negative for chest pain, palpitations and leg swelling.  Gastrointestinal: Negative for abdominal pain, blood in stool and nausea.  Genitourinary: Negative for dysuria and frequency.  Musculoskeletal: Negative for falls.  Skin: Negative for rash.  Neurological: Negative for dizziness, loss of consciousness and headaches.  Endo/Heme/Allergies: Negative for environmental allergies.  Psychiatric/Behavioral: Negative for depression. The patient is not nervous/anxious.        Objective:    Physical Exam  Constitutional: She is oriented to person, place, and time. She appears well-developed and well-nourished. No distress.  HENT:  Head: Normocephalic and atraumatic.  Eyes: Conjunctivae are normal.  Neck: Neck supple. No thyromegaly present.  Cardiovascular: Normal rate, regular rhythm and normal heart sounds.  No murmur heard. Pulmonary/Chest: Effort normal and breath sounds normal. No respiratory distress.  Abdominal: Soft. Bowel sounds are normal. She exhibits no distension and no mass. There is no tenderness.  Musculoskeletal: She exhibits no edema.  Lymphadenopathy:    She has no cervical adenopathy.  Neurological: She is alert and oriented to person, place, and time.  Skin: Skin is warm and dry.  Psychiatric: She has a normal mood and affect. Her behavior is normal.    BP (!) 98/58 (BP Location: Left Arm, Patient Position: Sitting, Cuff Size: Normal)   Pulse 74   Temp 98.4 F (36.9 C) (Oral)   Resp 18    Ht 5\' 4"  (1.626 m)   Wt 145 lb 12.8 oz (66.1 kg)   LMP 07/05/2010   SpO2 98%   BMI 25.03 kg/m  Wt Readings from Last 3 Encounters:  03/11/17 145 lb 12.8 oz (66.1 kg)  02/13/16 163 lb (73.9 kg)  08/11/15 159 lb 9.6 oz (72.4 kg)   BP Readings from Last 3 Encounters:  03/11/17 (!) 98/58  02/13/16 114/66  08/11/15 (!) 98/52     Immunization History  Administered Date(s) Administered  . IPV 10/15/2012  . Influenza Split 10/02/2011  .  Influenza-Unspecified 10/02/2014  . Tdap 01/01/2010    Health Maintenance  Topic Date Due  . Hepatitis C Screening  Oct 24, 1960  . HIV Screening  07/10/1976  . PAP SMEAR  06/01/2013  . MAMMOGRAM  03/17/2018  . TETANUS/TDAP  01/02/2020  . COLONOSCOPY  08/07/2022  . INFLUENZA VACCINE  Completed    Lab Results  Component Value Date   WBC 4.7 02/08/2016   HGB 14.4 02/08/2016   HCT 43.1 02/08/2016   PLT 244.0 02/08/2016   GLUCOSE 94 02/08/2016   CHOL 199 02/08/2016   TRIG 39.0 02/08/2016   HDL 68.80 02/08/2016   LDLCALC 123 (H) 02/08/2016   ALT 15 02/08/2016   AST 17 02/08/2016   NA 140 02/08/2016   K 3.7 02/08/2016   CL 102 02/08/2016   CREATININE 0.73 02/08/2016   BUN 13 02/08/2016   CO2 31 02/08/2016   TSH 0.15 (L) 07/09/2016    Lab Results  Component Value Date   TSH 0.15 (L) 07/09/2016   Lab Results  Component Value Date   WBC 4.7 02/08/2016   HGB 14.4 02/08/2016   HCT 43.1 02/08/2016   MCV 95.5 02/08/2016   PLT 244.0 02/08/2016   Lab Results  Component Value Date   NA 140 02/08/2016   K 3.7 02/08/2016   CO2 31 02/08/2016   GLUCOSE 94 02/08/2016   BUN 13 02/08/2016   CREATININE 0.73 02/08/2016   BILITOT 0.6 02/08/2016   ALKPHOS 75 02/08/2016   AST 17 02/08/2016   ALT 15 02/08/2016   PROT 7.1 02/08/2016   ALBUMIN 4.5 02/08/2016   CALCIUM 9.6 02/08/2016   GFR 88.11 02/08/2016   Lab Results  Component Value Date   CHOL 199 02/08/2016   Lab Results  Component Value Date   HDL 68.80 02/08/2016   Lab  Results  Component Value Date   LDLCALC 123 (H) 02/08/2016   Lab Results  Component Value Date   TRIG 39.0 02/08/2016   Lab Results  Component Value Date   CHOLHDL 3 02/08/2016   No results found for: HGBA1C       Assessment & Plan:   Problem List Items Addressed This Visit    Hypothyroidism    On Levothyroxine, continue to monitor      Relevant Orders   Comprehensive metabolic panel   Preventative health care     Patient encouraged to maintain heart healthy diet, regular exercise, adequate sleep. Consider daily probiotics. Take medications as prescribed. Labs ordered today      Relevant Orders   CBC   TSH   Overweight    Encouraged DASH diet, decrease po intake and increase exercise as tolerated. Needs 7-8 hours of sleep nightly. Avoid trans fats, eat small, frequent meals every 4-5 hours with lean proteins, complex carbs and healthy fats. Minimize simple carbs, great weight loss with using fitness pal and she is eating well and exercising.       Hyperlipidemia, mixed    Encouraged heart healthy diet, increase exercise, avoid trans fats, consider a krill oil cap daily      Relevant Orders   Lipid panel   TSH    Other Visit Diagnoses    Breast cancer screening    -  Primary   Relevant Orders   MM DIAG BREAST TOMO BILATERAL      I am having Jordan Juarez maintain her ibuprofen, cetirizine, acetaminophen, acyclovir, scopolamine, typhoid, levothyroxine, and atovaquone-proguanil.  No orders of the defined types were placed in this encounter.  CMA served as Education administrator during this visit. History, Physical and Plan performed by medical provider. Documentation and orders reviewed and attested to.  Penni Homans, MD

## 2017-03-11 NOTE — Patient Instructions (Addendum)
Recommend calcium intake of 1200 to 1500 mg daily, divided into roughly 3 doses. Best source is the diet and a single dairy serving is about 500 mg, a supplement of calcium citrate once or twice daily to balance diet is fine if not getting enough in diet. Also need Vitamin D 2000 IU caps, 1 cap daily if not already taking vitamin D. Also recommend weight baring exercise on hips and upper body to keep bones strong  Preventive Care 40-64 Years, Female Preventive care refers to lifestyle choices and visits with your health care provider that can promote health and wellness. What does preventive care include?  A yearly physical exam. This is also called an annual well check.  Dental exams once or twice a year.  Routine eye exams. Ask your health care provider how often you should have your eyes checked.  Personal lifestyle choices, including: ? Daily care of your teeth and gums. ? Regular physical activity. ? Eating a healthy diet. ? Avoiding tobacco and drug use. ? Limiting alcohol use. ? Practicing safe sex. ? Taking low-dose aspirin daily starting at age 71. ? Taking vitamin and mineral supplements as recommended by your health care provider. What happens during an annual well check? The services and screenings done by your health care provider during your annual well check will depend on your age, overall health, lifestyle risk factors, and family history of disease. Counseling Your health care provider may ask you questions about your:  Alcohol use.  Tobacco use.  Drug use.  Emotional well-being.  Home and relationship well-being.  Sexual activity.  Eating habits.  Work and work Statistician.  Method of birth control.  Menstrual cycle.  Pregnancy history.  Screening You may have the following tests or measurements:  Height, weight, and BMI.  Blood pressure.  Lipid and cholesterol levels. These may be checked every 5 years, or more frequently if you are over 17  years old.  Skin check.  Lung cancer screening. You may have this screening every year starting at age 35 if you have a 30-pack-year history of smoking and currently smoke or have quit within the past 15 years.  Fecal occult blood test (FOBT) of the stool. You may have this test every year starting at age 73.  Flexible sigmoidoscopy or colonoscopy. You may have a sigmoidoscopy every 5 years or a colonoscopy every 10 years starting at age 28.  Hepatitis C blood test.  Hepatitis B blood test.  Sexually transmitted disease (STD) testing.  Diabetes screening. This is done by checking your blood sugar (glucose) after you have not eaten for a while (fasting). You may have this done every 1-3 years.  Mammogram. This may be done every 1-2 years. Talk to your health care provider about when you should start having regular mammograms. This may depend on whether you have a family history of breast cancer.  BRCA-related cancer screening. This may be done if you have a family history of breast, ovarian, tubal, or peritoneal cancers.  Pelvic exam and Pap test. This may be done every 3 years starting at age 16. Starting at age 47, this may be done every 5 years if you have a Pap test in combination with an HPV test.  Bone density scan. This is done to screen for osteoporosis. You may have this scan if you are at high risk for osteoporosis.  Discuss your test results, treatment options, and if necessary, the need for more tests with your health care provider. Vaccines Your  health care provider may recommend certain vaccines, such as:  Influenza vaccine. This is recommended every year.  Tetanus, diphtheria, and acellular pertussis (Tdap, Td) vaccine. You may need a Td booster every 10 years.  Varicella vaccine. You may need this if you have not been vaccinated.  Zoster vaccine. You may need this after age 58.  Measles, mumps, and rubella (MMR) vaccine. You may need at least one dose of MMR if  you were born in 1957 or later. You may also need a second dose.  Pneumococcal 13-valent conjugate (PCV13) vaccine. You may need this if you have certain conditions and were not previously vaccinated.  Pneumococcal polysaccharide (PPSV23) vaccine. You may need one or two doses if you smoke cigarettes or if you have certain conditions.  Meningococcal vaccine. You may need this if you have certain conditions.  Hepatitis A vaccine. You may need this if you have certain conditions or if you travel or work in places where you may be exposed to hepatitis A.  Hepatitis B vaccine. You may need this if you have certain conditions or if you travel or work in places where you may be exposed to hepatitis B.  Haemophilus influenzae type b (Hib) vaccine. You may need this if you have certain conditions.  Talk to your health care provider about which screenings and vaccines you need and how often you need them. This information is not intended to replace advice given to you by your health care provider. Make sure you discuss any questions you have with your health care provider. Document Released: 01/14/2015 Document Revised: 09/07/2015 Document Reviewed: 10/19/2014 Elsevier Interactive Patient Education  [employer].

## 2017-03-12 LAB — TSH: TSH: 0.49 u[IU]/mL (ref 0.35–4.50)

## 2017-03-12 NOTE — Addendum Note (Signed)
Addended by: [name] A on: 03/12/2017 06:08 PM   Modules accepted: Orders

## 2017-04-16 ENCOUNTER — Ambulatory Visit
Admission: RE | Admit: 2017-04-16 | Discharge: 2017-04-16 | Disposition: A | Discharge status: Home / Self Care | Payer: 59 | Source: Ambulatory Visit | Attending: Family Medicine | Admitting: Family Medicine

## 2017-04-16 DIAGNOSIS — Z1239 Encounter for other screening for malignant neoplasm of breast: Secondary | ICD-10-CM

## 2017-04-16 DIAGNOSIS — Z1231 Encounter for screening mammogram for malignant neoplasm of breast: Secondary | ICD-10-CM | POA: Diagnosis not present

## 2017-04-18 ENCOUNTER — Other Ambulatory Visit: Payer: Self-pay | Admitting: Family Medicine

## 2017-07-16 ENCOUNTER — Other Ambulatory Visit: Payer: Self-pay | Admitting: Family Medicine

## 2017-12-13 DIAGNOSIS — H524 Presbyopia: Secondary | ICD-10-CM | POA: Diagnosis not present

## 2018-01-23 ENCOUNTER — Other Ambulatory Visit: Payer: Self-pay | Admitting: Family Medicine

## 2018-05-03 ENCOUNTER — Other Ambulatory Visit: Payer: Self-pay | Admitting: Family Medicine

## 2018-06-26 DIAGNOSIS — L988 Other specified disorders of the skin and subcutaneous tissue: Secondary | ICD-10-CM | POA: Diagnosis not present

## 2018-06-26 DIAGNOSIS — D2262 Melanocytic nevi of left upper limb, including shoulder: Secondary | ICD-10-CM | POA: Diagnosis not present

## 2018-06-26 DIAGNOSIS — D2371 Other benign neoplasm of skin of right lower limb, including hip: Secondary | ICD-10-CM | POA: Diagnosis not present

## 2018-06-26 DIAGNOSIS — L814 Other melanin hyperpigmentation: Secondary | ICD-10-CM | POA: Diagnosis not present

## 2018-06-26 DIAGNOSIS — D2261 Melanocytic nevi of right upper limb, including shoulder: Secondary | ICD-10-CM | POA: Diagnosis not present

## 2018-06-26 DIAGNOSIS — D485 Neoplasm of uncertain behavior of skin: Secondary | ICD-10-CM | POA: Diagnosis not present

## 2018-06-26 DIAGNOSIS — D1801 Hemangioma of skin and subcutaneous tissue: Secondary | ICD-10-CM | POA: Diagnosis not present

## 2018-06-26 DIAGNOSIS — D225 Melanocytic nevi of trunk: Secondary | ICD-10-CM | POA: Diagnosis not present

## 2018-06-26 DIAGNOSIS — D2239 Melanocytic nevi of other parts of face: Secondary | ICD-10-CM | POA: Diagnosis not present

## 2018-06-26 DIAGNOSIS — D235 Other benign neoplasm of skin of trunk: Secondary | ICD-10-CM | POA: Diagnosis not present

## 2018-06-26 DIAGNOSIS — L720 Epidermal cyst: Secondary | ICD-10-CM | POA: Diagnosis not present

## 2018-07-14 DIAGNOSIS — L988 Other specified disorders of the skin and subcutaneous tissue: Secondary | ICD-10-CM | POA: Diagnosis not present

## 2018-07-14 DIAGNOSIS — D485 Neoplasm of uncertain behavior of skin: Secondary | ICD-10-CM | POA: Diagnosis not present

## 2018-09-10 ENCOUNTER — Other Ambulatory Visit: Payer: Self-pay | Admitting: Family Medicine

## 2018-09-10 ENCOUNTER — Telehealth: Payer: Self-pay | Admitting: Family Medicine

## 2018-09-10 MED ORDER — LEVOTHYROXINE SODIUM 112 MCG PO TABS: ORAL_TABLET | ORAL | 0 refills | Status: DC

## 2018-09-10 NOTE — Telephone Encounter (Signed)
Ok to send in levothyroxine?

## 2018-09-10 NOTE — Telephone Encounter (Signed)
I sent it in to the pharmacy for her. Please let her know

## 2018-09-10 NOTE — Telephone Encounter (Signed)
Pt called regarding levothyroxine (SYNTHROID) 112 MCG tablet. She has been out for about 2 months and has been worried about coming into the office. Pt states that she has gained 10 lbs in 2 months of being off the medication. She is scheduled for a doxy on 9/22 with Dr. Charlett Blake but hoping medication can be sent in for her before that. Please advise thru mychart if medication is sent.  [facility] [place], [place] - [address] OF [place] [address] [phone] (Phone) [phone] (Fax)

## 2018-09-11 NOTE — Telephone Encounter (Signed)
Pt informed

## 2018-09-23 ENCOUNTER — Other Ambulatory Visit: Payer: Self-pay

## 2018-09-23 ENCOUNTER — Ambulatory Visit (INDEPENDENT_AMBULATORY_CARE_PROVIDER_SITE_OTHER): Payer: 59 | Admitting: Family Medicine

## 2018-09-23 DIAGNOSIS — D649 Anemia, unspecified: Secondary | ICD-10-CM

## 2018-09-23 DIAGNOSIS — E782 Mixed hyperlipidemia: Secondary | ICD-10-CM

## 2018-09-23 DIAGNOSIS — B001 Herpesviral vesicular dermatitis: Secondary | ICD-10-CM

## 2018-09-23 DIAGNOSIS — E663 Overweight: Secondary | ICD-10-CM

## 2018-09-23 DIAGNOSIS — E039 Hypothyroidism, unspecified: Secondary | ICD-10-CM | POA: Diagnosis not present

## 2018-09-23 MED ORDER — LEVOTHYROXINE SODIUM 112 MCG PO TABS: ORAL_TABLET | ORAL | 1 refills | Status: DC

## 2018-09-23 MED FILL — LEVOTHYROXINE 112 MCG TAB: 112 | 90 days supply | Qty: 90 | Fill #0

## 2018-09-23 NOTE — Progress Notes (Signed)
Patient d/c acyclovir and wants valtrex instead

## 2018-09-24 ENCOUNTER — Other Ambulatory Visit: Payer: Self-pay | Admitting: Family Medicine

## 2018-09-24 DIAGNOSIS — B001 Herpesviral vesicular dermatitis: Secondary | ICD-10-CM | POA: Insufficient documentation

## 2018-09-24 DIAGNOSIS — E039 Hypothyroidism, unspecified: Secondary | ICD-10-CM

## 2018-09-24 DIAGNOSIS — E663 Overweight: Secondary | ICD-10-CM

## 2018-09-24 MED ORDER — VALACYCLOVIR HCL 500 MG PO TABS: 500.0000 mg | ORAL_TABLET | Freq: Every day | ORAL | 5 refills | Status: DC

## 2018-09-24 NOTE — Assessment & Plan Note (Signed)
Encouraged heart healthy diet, decrease po intake and increase exercise as tolerated. Needs 7-8 hours of sleep nightly. Avoid trans fats, eat small, frequent meals every 4-5 hours with lean proteins, complex carbs and healthy fats. Minimize simple carbs

## 2018-09-24 NOTE — Assessment & Plan Note (Signed)
On Levothyroxine, continue to monitor 

## 2018-09-24 NOTE — Assessment & Plan Note (Signed)
Encouraged heart healthy diet, increase exercise, avoid trans fats, consider a krill oil cap daily 

## 2018-09-24 NOTE — Progress Notes (Signed)
Virtual Visit via Video Note  I connected with [name] on 09/23/18 at  by a video enabled telemedicine application and verified that I am speaking with the correct person using two identifiers.  Location: Patient: home Provider: office   I discussed the limitations of evaluation and management by telemedicine and the availability of in person appointments. The patient expressed understanding and agreed to proceed. Jordan Juarez, CMA was able to set up patient on video visit.    Subjective:    Patient ID: Jordan Juarez, female    DOB: March 27, 1960, 58 y.o.   MRN: [id number]  No chief complaint on file.   HPI Patient is in today for follow up on chronic medical concerns including hypothyroidism, hyperlipidemia and more. She feels well today but she is concerned about weight gain during the pandemic. Denies CP/palp/SOB/HA/congestion/fevers/GI or GU c/o. Taking meds as prescribed  Past Medical History:  Diagnosis Date  . Allergic state 06/03/2012  . Anemia 06/03/2012  . Breast lesion on mammography 06/03/2012   S/p biopsy on left with marker left behind, benign  . Chicken pox as a child  . Hyperlipidemia, mixed 08/11/2015  . Hypothyroidism   . Overweight 08/08/2014  . PONV (postoperative nausea and vomiting)   . Preventative health care 06/03/2012  . Sun-damaged skin 06/03/2012  . Thyroid nodule 06/03/2012  . Unspecified hypothyroidism 06/03/2012    Past Surgical History:  Procedure Laterality Date  . ABDOMINAL HYSTERECTOMY  07-13-10   still has ovaries  . BREAST BIOPSY  2011  . BREAST SURGERY     left breast biopsies  . ENDOMETRIAL ABLATION  2009   done in office- n&v postop  . LAPAROSCOPIC ASSISTED VAGINAL HYSTERECTOMY  07/13/2010   Procedure: LAPAROSCOPIC ASSISTED VAGINAL HYSTERECTOMY;  Surgeon: Margarette Asal;  Location: [facility];  Service: Gynecology;  Laterality: N/A;  . MOLE REMOVAL Right    forehead, 4th grade    Family History  Problem Relation Age of Onset  . Heart  attack Mother   . Emphysema Mother        smoker  . Thyroid disease Mother   . Hypertension Father   . Leukemia Sister        acute/ smoker  . Thyroid disease Sister   . Depression Sister        serious MVA, chronic pain  . Cancer Maternal Grandmother        lung/ smoker  . Diabetes Maternal Grandmother        type 2  . Stroke Maternal Grandfather        several  . Hypertension Maternal Grandfather   . Cancer Paternal Grandfather        bone/ smoker  . Cancer Daughter        2 abnormal skin lesion excisions at age 23 first time  . Asthma Son   . Colon cancer Neg Hx   . Rectal cancer Neg Hx   . Stomach cancer Neg Hx     Social History   Socioeconomic History  . Marital status: Married    Spouse name: Not on file  . Number of children: Not on file  . Years of education: Not on file  . Highest education level: Not on file  Occupational History  . Not on file  Social Needs  . Financial resource strain: Not on file  . Food insecurity    Worry: Not on file    Inability: Not on file  . Transportation needs    Medical: Not  on file    Non-medical: Not on file  Tobacco Use  . Smoking status: Never Smoker  . Smokeless tobacco: Never Used  Substance and Sexual Activity  . Alcohol use: Yes    Alcohol/week: 2.0 standard drinks    Types: 2 Glasses of wine per week  . Drug use: No  . Sexual activity: Yes  Lifestyle  . Physical activity    Days per week: Not on file    Minutes per session: Not on file  . Stress: Not on file  Relationships  . Social Herbalist on phone: Not on file    Gets together: Not on file    Attends religious service: Not on file    Active member of club or organization: Not on file    Attends meetings of clubs or organizations: Not on file    Relationship status: Not on file  . Intimate partner violence    Fear of current or ex partner: Not on file    Emotionally abused: Not on file    Physically abused: Not on file    Forced  sexual activity: Not on file  Other Topics Concern  . Not on file  Social History Narrative  . Not on file    Outpatient Medications Prior to Visit  Medication Sig Dispense Refill  . acetaminophen (TYLENOL) 500 MG tablet Take 500 mg by mouth every 6 (six) hours as needed for pain.    Marland Kitchen atovaquone-proguanil (MALARONE) 250-100 MG TABS tablet Take 1 tablet by mouth daily. 40 tablet 0  . cetirizine (ZYRTEC) 10 MG tablet Take 10 mg by mouth daily.    Marland Kitchen ibuprofen (ADVIL) 200 MG tablet Take 200 mg by mouth every 6 (six) hours as needed for pain.    Marland Kitchen levothyroxine (SYNTHROID) 112 MCG tablet TAKE 1 TABLET(112 MCG) BY MOUTH DAILY 90 tablet 1  . scopolamine (TRANSDERM-SCOP, 1.5 MG,) 1 MG/3DAYS Place 1 patch (1.5 mg total) onto the skin as needed. 10 patch 1  . typhoid (VIVOTIF) DR capsule Take 1 capsule by mouth every other day. 4 capsule 0   No facility-administered medications prior to visit.     No Known Allergies  Review of Systems  Constitutional: Positive for malaise/fatigue. Negative for fever.  HENT: Negative for congestion.   Eyes: Negative for blurred vision.  Respiratory: Negative for shortness of breath.   Cardiovascular: Negative for chest pain, palpitations and leg swelling.  Gastrointestinal: Negative for abdominal pain, blood in stool and nausea.  Genitourinary: Negative for dysuria and frequency.  Musculoskeletal: Negative for falls.  Skin: Negative for rash.  Neurological: Negative for dizziness, loss of consciousness and headaches.  Endo/Heme/Allergies: Negative for environmental allergies.  Psychiatric/Behavioral: Negative for depression. The patient is not nervous/anxious.        Objective:    Physical Exam Constitutional:      Appearance: Normal appearance. She is not ill-appearing.  HENT:     Right Ear: External ear normal.     Left Ear: External ear normal.     Nose: Nose normal.  Pulmonary:     Effort: Pulmonary effort is normal.  Neurological:      Mental Status: She is alert and oriented to person, place, and time.  Psychiatric:        Mood and Affect: Mood normal.        Behavior: Behavior normal.     LMP 07/05/2010  Wt Readings from Last 3 Encounters:  09/23/18 157 lb (71.2 kg)  03/11/17  145 lb 12.8 oz (66.1 kg)  02/13/16 163 lb (73.9 kg)    Diabetic Foot Exam - Simple   No data filed     Lab Results  Component Value Date   WBC 3.3 (L) 03/11/2017   HGB 14.1 03/11/2017   HCT 41.7 03/11/2017   PLT 188.0 03/11/2017   GLUCOSE 89 03/11/2017   CHOL 205 (H) 03/11/2017   TRIG 43.0 03/11/2017   HDL 71.50 03/11/2017   LDLCALC 125 (H) 03/11/2017   ALT 13 03/11/2017   AST 18 03/11/2017   NA 142 03/11/2017   K 5.1 03/11/2017   CL 105 03/11/2017   CREATININE 0.68 03/11/2017   BUN 11 03/11/2017   CO2 31 03/11/2017   TSH 0.49 03/11/2017    Lab Results  Component Value Date   TSH 0.49 03/11/2017   Lab Results  Component Value Date   WBC 3.3 (L) 03/11/2017   HGB 14.1 03/11/2017   HCT 41.7 03/11/2017   MCV 95.2 03/11/2017   PLT 188.0 03/11/2017   Lab Results  Component Value Date   NA 142 03/11/2017   K 5.1 03/11/2017   CO2 31 03/11/2017   GLUCOSE 89 03/11/2017   BUN 11 03/11/2017   CREATININE 0.68 03/11/2017   BILITOT 0.7 03/11/2017   ALKPHOS 69 03/11/2017   AST 18 03/11/2017   ALT 13 03/11/2017   PROT 7.0 03/11/2017   ALBUMIN 4.3 03/11/2017   CALCIUM 10.2 03/11/2017   GFR 95.25 03/11/2017   Lab Results  Component Value Date   CHOL 205 (H) 03/11/2017   Lab Results  Component Value Date   HDL 71.50 03/11/2017   Lab Results  Component Value Date   LDLCALC 125 (H) 03/11/2017   Lab Results  Component Value Date   TRIG 43.0 03/11/2017   Lab Results  Component Value Date   CHOLHDL 3 03/11/2017   No results found for: HGBA1C     Assessment & Plan:   Problem List Items Addressed This Visit    Hypothyroidism    On Levothyroxine, continue to monitor      Overweight    Encouraged heart  healthy diet, decrease po intake and increase exercise as tolerated. Needs 7-8 hours of sleep nightly. Avoid trans fats, eat small, frequent meals every 4-5 hours with lean proteins, complex carbs and healthy fats. Minimize simple carbs         I am having Jacobi Dorrance maintain her ibuprofen, cetirizine, acetaminophen, scopolamine, typhoid, atovaquone-proguanil, and levothyroxine.  No orders of the defined types were placed in this encounter.     I discussed the assessment and treatment plan with the patient. The patient was provided an opportunity to ask questions and all were answered. The patient agreed with the plan and demonstrated an understanding of the instructions.   The patient was advised to call back or seek an in-person evaluation if the symptoms worsen or if the condition fails to improve as anticipated.  I provided 25 minutes of non-face-to-face time during this encounter.   Penni Homans, MD

## 2018-09-24 NOTE — Assessment & Plan Note (Addendum)
On Levothyroxine but was out for several weeks before restarting 1-2 weeks ago. Will check TSH in lat October. Continue current dose for now.

## 2018-09-24 NOTE — Assessment & Plan Note (Signed)
Increase leafy greens, consider increased lean red meat and using cast iron cookware. Continue to monitor, report any concerns 

## 2018-09-24 NOTE — Assessment & Plan Note (Signed)
Very frustrated with weight gain during pandemic. Encouraged increased exercise and decreased carbs. Consider weight watchers APP

## 2018-09-24 NOTE — Assessment & Plan Note (Signed)
Has been using Acyclovir but would like to change to Valtrex

## 2018-09-24 NOTE — Progress Notes (Signed)
Patient ID: Jordan Juarez, female   DOB: 1960/10/28, 58 y.o.   MRN: [id number] Virtual Visit via Video Note  I connected with Jordan Juarez on 09/24/18 at  2:40 PM EDT by a video enabled telemedicine application and verified that I am speaking with the correct person using two identifiers.  Location: Patient: home Provider: office   I discussed the limitations of evaluation and management by telemedicine and the availability of in person appointments. The patient expressed understanding and agreed to proceed. [name], CMA was able to get the patient set up on a video visit   Subjective:    Patient ID: Jordan Juarez, female    DOB: 1960-04-18, 58 y.o.   MRN: [id number]  No chief complaint on file.   HPI Patient is in today for follow up on chronic medical concerns including hypothyroidism, hyperlipidemia, and more. Her children have moved back from [place] and her youngest son has gottenmarried. Denies CP/palp/SOB/HA/congestion/fevers/GI or GU c/o. Taking meds as prescribed. She is under a great deal of stress as her father is now living with them. Denies CP/palp/SOB/HA/congestion/fevers/GI or GU c/o. Taking meds as prescribed  Past Medical History:  Diagnosis Date  . Allergic state 06/03/2012  . Anemia 06/03/2012  . Breast lesion on mammography 06/03/2012   S/p biopsy on left with marker left behind, benign  . Chicken pox as a child  . Hyperlipidemia, mixed 08/11/2015  . Hypothyroidism   . Overweight 08/08/2014  . PONV (postoperative nausea and vomiting)   . Preventative health care 06/03/2012  . Sun-damaged skin 06/03/2012  . Thyroid nodule 06/03/2012  . Unspecified hypothyroidism 06/03/2012    Past Surgical History:  Procedure Laterality Date  . ABDOMINAL HYSTERECTOMY  07-13-10   still has ovaries  . BREAST BIOPSY  2011  . BREAST SURGERY     left breast biopsies  . ENDOMETRIAL ABLATION  2009   done in office- n&v postop  . LAPAROSCOPIC ASSISTED VAGINAL HYSTERECTOMY   07/13/2010   Procedure: LAPAROSCOPIC ASSISTED VAGINAL HYSTERECTOMY;  Surgeon: Margarette Asal;  Location: [facility];  Service: Gynecology;  Laterality: N/A;  . MOLE REMOVAL Right    forehead, 4th grade    Family History  Problem Relation Age of Onset  . Heart attack Mother   . Emphysema Mother        smoker  . Thyroid disease Mother   . Hypertension Father   . Leukemia Sister        acute/ smoker  . Thyroid disease Sister   . Depression Sister        serious MVA, chronic pain  . Cancer Maternal Grandmother        lung/ smoker  . Diabetes Maternal Grandmother        type 2  . Stroke Maternal Grandfather        several  . Hypertension Maternal Grandfather   . Cancer Paternal Grandfather        bone/ smoker  . Cancer Daughter        2 abnormal skin lesion excisions at age 77 first time  . Asthma Son   . Colon cancer Neg Hx   . Rectal cancer Neg Hx   . Stomach cancer Neg Hx     Social History   Socioeconomic History  . Marital status: Married    Spouse name: Not on file  . Number of children: Not on file  . Years of education: Not on file  . Highest education level: Not on file  Occupational  History  . Not on file  Social Needs  . Financial resource strain: Not on file  . Food insecurity    Worry: Not on file    Inability: Not on file  . Transportation needs    Medical: Not on file    Non-medical: Not on file  Tobacco Use  . Smoking status: Never Smoker  . Smokeless tobacco: Never Used  Substance and Sexual Activity  . Alcohol use: Yes    Alcohol/week: 2.0 standard drinks    Types: 2 Glasses of wine per week  . Drug use: No  . Sexual activity: Yes  Lifestyle  . Physical activity    Days per week: Not on file    Minutes per session: Not on file  . Stress: Not on file  Relationships  . Social Herbalist on phone: Not on file    Gets together: Not on file    Attends religious service: Not on file    Active member of club or organization: Not on  file    Attends meetings of clubs or organizations: Not on file    Relationship status: Not on file  . Intimate partner violence    Fear of current or ex partner: Not on file    Emotionally abused: Not on file    Physically abused: Not on file    Forced sexual activity: Not on file  Other Topics Concern  . Not on file  Social History Narrative  . Not on file    Outpatient Medications Prior to Visit  Medication Sig Dispense Refill  . acetaminophen (TYLENOL) 500 MG tablet Take 500 mg by mouth every 6 (six) hours as needed for pain.    Marland Kitchen atovaquone-proguanil (MALARONE) 250-100 MG TABS tablet Take 1 tablet by mouth daily. 40 tablet 0  . cetirizine (ZYRTEC) 10 MG tablet Take 10 mg by mouth daily.    Marland Kitchen ibuprofen (ADVIL) 200 MG tablet Take 200 mg by mouth every 6 (six) hours as needed for pain.    Marland Kitchen scopolamine (TRANSDERM-SCOP, 1.5 MG,) 1 MG/3DAYS Place 1 patch (1.5 mg total) onto the skin as needed. 10 patch 1  . typhoid (VIVOTIF) DR capsule Take 1 capsule by mouth every other day. 4 capsule 0  . levothyroxine (SYNTHROID) 112 MCG tablet TAKE 1 TABLET(112 MCG) BY MOUTH DAILY 90 tablet 0  . acyclovir (ZOVIRAX) 400 MG tablet Take 1 tablet (400 mg total) by mouth 3 (three) times daily. 15 tablet 2   No facility-administered medications prior to visit.     No Known Allergies  Review of Systems  Constitutional: Positive for malaise/fatigue. Negative for fever.  HENT: Negative for congestion.   Eyes: Negative for blurred vision.  Respiratory: Negative for shortness of breath.   Cardiovascular: Negative for chest pain, palpitations and leg swelling.  Gastrointestinal: Negative for abdominal pain, blood in stool and nausea.  Genitourinary: Negative for dysuria and frequency.  Musculoskeletal: Negative for falls.  Skin: Negative for rash.  Neurological: Negative for dizziness, loss of consciousness and headaches.  Endo/Heme/Allergies: Negative for environmental allergies.   Psychiatric/Behavioral: Negative for depression. The patient is nervous/anxious.        Objective:    Physical Exam  Wt 157 lb (71.2 kg)   LMP 07/05/2010   BMI 26.95 kg/m  Wt Readings from Last 3 Encounters:  09/23/18 157 lb (71.2 kg)  03/11/17 145 lb 12.8 oz (66.1 kg)  02/13/16 163 lb (73.9 kg)    Diabetic Foot Exam -  Simple   No data filed     Lab Results  Component Value Date   WBC 3.3 (L) 03/11/2017   HGB 14.1 03/11/2017   HCT 41.7 03/11/2017   PLT 188.0 03/11/2017   GLUCOSE 89 03/11/2017   CHOL 205 (H) 03/11/2017   TRIG 43.0 03/11/2017   HDL 71.50 03/11/2017   LDLCALC 125 (H) 03/11/2017   ALT 13 03/11/2017   AST 18 03/11/2017   NA 142 03/11/2017   K 5.1 03/11/2017   CL 105 03/11/2017   CREATININE 0.68 03/11/2017   BUN 11 03/11/2017   CO2 31 03/11/2017   TSH 0.49 03/11/2017    Lab Results  Component Value Date   TSH 0.49 03/11/2017   Lab Results  Component Value Date   WBC 3.3 (L) 03/11/2017   HGB 14.1 03/11/2017   HCT 41.7 03/11/2017   MCV 95.2 03/11/2017   PLT 188.0 03/11/2017   Lab Results  Component Value Date   NA 142 03/11/2017   K 5.1 03/11/2017   CO2 31 03/11/2017   GLUCOSE 89 03/11/2017   BUN 11 03/11/2017   CREATININE 0.68 03/11/2017   BILITOT 0.7 03/11/2017   ALKPHOS 69 03/11/2017   AST 18 03/11/2017   ALT 13 03/11/2017   PROT 7.0 03/11/2017   ALBUMIN 4.3 03/11/2017   CALCIUM 10.2 03/11/2017   GFR 95.25 03/11/2017   Lab Results  Component Value Date   CHOL 205 (H) 03/11/2017   Lab Results  Component Value Date   HDL 71.50 03/11/2017   Lab Results  Component Value Date   LDLCALC 125 (H) 03/11/2017   Lab Results  Component Value Date   TRIG 43.0 03/11/2017   Lab Results  Component Value Date   CHOLHDL 3 03/11/2017   No results found for: HGBA1C     Assessment & Plan:   Problem List Items Addressed This Visit    Hypothyroidism    On Levothyroxine but was out for several weeks before restarting 1-2 weeks  ago. Will check TSH in lat October. Continue current dose for now.       Relevant Medications   levothyroxine (SYNTHROID) 112 MCG tablet   Anemia    Increase leafy greens, consider increased lean red meat and using cast iron cookware. Continue to monitor, report any concerns      Overweight    Very frustrated with weight gain during pandemic. Encouraged increased exercise and decreased carbs. Consider weight watchers APP      Hyperlipidemia, mixed    Encouraged heart healthy diet, increase exercise, avoid trans fats, consider a krill oil cap daily      Cold sore    Has been using Acyclovir but would like to change to Valtrex      Relevant Medications   valACYclovir (VALTREX) 500 MG tablet      I have discontinued Verlaine Lapiana's acyclovir. I am also having her start on valACYclovir. Additionally, I am having her maintain her ibuprofen, cetirizine, acetaminophen, scopolamine, typhoid, atovaquone-proguanil, and levothyroxine.  Meds ordered this encounter  Medications  . levothyroxine (SYNTHROID) 112 MCG tablet    Sig: TAKE 1 TABLET(112 MCG) BY MOUTH DAILY    Dispense:  90 tablet    Refill:  1  . valACYclovir (VALTREX) 500 MG tablet    Sig: Take 1 tablet (500 mg total) by mouth daily.    Dispense:  30 tablet    Refill:  5      I discussed the assessment and treatment plan with  the patient. The patient was provided an opportunity to ask questions and all were answered. The patient agreed with the plan and demonstrated an understanding of the instructions.   The patient was advised to call back or seek an in-person evaluation if the symptoms worsen or if the condition fails to improve as anticipated.  I provided 25 minutes of non-face-to-face time during this encounter.   Penni Homans, MD

## 2018-09-25 MED FILL — VALACYCLOVIR HCL 500 MG TAB: 500 | 30 days supply | Qty: 30 | Fill #0

## 2018-12-15 ENCOUNTER — Other Ambulatory Visit: Payer: Self-pay | Admitting: Family Medicine

## 2018-12-15 ENCOUNTER — Telehealth: Payer: Self-pay | Admitting: Family Medicine

## 2018-12-15 DIAGNOSIS — E039 Hypothyroidism, unspecified: Secondary | ICD-10-CM

## 2018-12-15 DIAGNOSIS — D72819 Decreased white blood cell count, unspecified: Secondary | ICD-10-CM

## 2018-12-15 DIAGNOSIS — E782 Mixed hyperlipidemia: Secondary | ICD-10-CM

## 2018-12-15 NOTE — Telephone Encounter (Signed)
Patient called and would like to talk to someone about her thyroid medication and labs put in for her. Please call patient back, thanks.

## 2018-12-15 NOTE — Telephone Encounter (Signed)
Spoke with pt and scheduled lab appt for tomorrow at 1:30pm. Pt is wanting to know if she should be fasting? Pt would like someone to let her know if she should. Please review and place appropriate orders.

## 2018-12-16 ENCOUNTER — Other Ambulatory Visit: Payer: Self-pay

## 2018-12-16 ENCOUNTER — Other Ambulatory Visit (INDEPENDENT_AMBULATORY_CARE_PROVIDER_SITE_OTHER): Payer: 59

## 2018-12-16 DIAGNOSIS — E782 Mixed hyperlipidemia: Secondary | ICD-10-CM

## 2018-12-16 DIAGNOSIS — D72819 Decreased white blood cell count, unspecified: Secondary | ICD-10-CM

## 2018-12-16 DIAGNOSIS — E039 Hypothyroidism, unspecified: Secondary | ICD-10-CM | POA: Diagnosis not present

## 2018-12-16 LAB — CBC
HCT: 42.4 % (ref 36.0–46.0)
Hemoglobin: 14 g/dL (ref 12.0–15.0)
MCHC: 33.1 g/dL (ref 30.0–36.0)
MCV: 97.4 fl (ref 78.0–100.0)
Platelets: 224 10*3/uL (ref 150.0–400.0)
RBC: 4.36 Mil/uL (ref 3.87–5.11)
RDW: 13.3 % (ref 11.5–15.5)
WBC: 4.6 10*3/uL (ref 4.0–10.5)

## 2018-12-16 LAB — LIPID PANEL
Cholesterol: 202 mg/dL — ABNORMAL HIGH (ref 0–200)
HDL: 68.8 mg/dL (ref >39.00)
LDL Cholesterol: 120 mg/dL — ABNORMAL HIGH (ref 0–99)
NonHDL: 133.65
Total CHOL/HDL Ratio: 3
Triglycerides: 67 mg/dL (ref 0.0–149.0)
VLDL: 13.4 mg/dL (ref 0.0–40.0)

## 2018-12-16 LAB — COMPREHENSIVE METABOLIC PANEL
ALT: 12 U/L (ref 0–35)
AST: 16 U/L (ref 0–37)
Albumin: 4.4 g/dL (ref 3.5–5.2)
Alkaline Phosphatase: 79 U/L (ref 39–117)
BUN: 13 mg/dL (ref 6–23)
CO2: 32 mEq/L (ref 19–32)
Calcium: 9.9 mg/dL (ref 8.4–10.5)
Chloride: 103 mEq/L (ref 96–112)
Creatinine, Ser: 0.74 mg/dL (ref 0.40–1.20)
GFR: 80.77 mL/min (ref >60.00)
Glucose, Bld: 78 mg/dL (ref 70–99)
Potassium: 4.6 mEq/L (ref 3.5–5.1)
Sodium: 141 mEq/L (ref 135–145)
Total Bilirubin: 0.5 mg/dL (ref 0.2–1.2)
Total Protein: 6.7 g/dL (ref 6.0–8.3)

## 2018-12-16 LAB — TSH: TSH: 0.2 u[IU]/mL — ABNORMAL LOW (ref 0.35–4.50)

## 2018-12-17 ENCOUNTER — Encounter: Payer: Self-pay | Admitting: Family Medicine

## 2018-12-17 ENCOUNTER — Other Ambulatory Visit: Payer: Self-pay | Admitting: Family Medicine

## 2018-12-18 NOTE — Telephone Encounter (Signed)
Last OV 09/23/18 Last refill 09/23/18 #90/1 Next OV 03/10/19

## 2019-02-12 ENCOUNTER — Ambulatory Visit: Payer: 59 | Attending: Internal Medicine

## 2019-02-12 DIAGNOSIS — Z20822 Contact with and (suspected) exposure to covid-19: Secondary | ICD-10-CM

## 2019-02-13 LAB — NOVEL CORONAVIRUS, NAA: SARS-CoV-2, NAA: NOT DETECTED

## 2019-03-09 ENCOUNTER — Other Ambulatory Visit: Payer: Self-pay

## 2019-03-10 ENCOUNTER — Encounter: Payer: Self-pay | Admitting: Family Medicine

## 2019-03-10 ENCOUNTER — Ambulatory Visit (INDEPENDENT_AMBULATORY_CARE_PROVIDER_SITE_OTHER): Payer: 59 | Admitting: Family Medicine

## 2019-03-10 ENCOUNTER — Other Ambulatory Visit: Payer: Self-pay | Admitting: Family Medicine

## 2019-03-10 ENCOUNTER — Other Ambulatory Visit: Payer: Self-pay

## 2019-03-10 VITALS — BP 106/49 | HR 63 | Temp 96.5°F | Resp 12 | Ht 64.0 in | Wt 151.8 lb

## 2019-03-10 DIAGNOSIS — E663 Overweight: Secondary | ICD-10-CM

## 2019-03-10 DIAGNOSIS — Z23 Encounter for immunization: Secondary | ICD-10-CM

## 2019-03-10 DIAGNOSIS — Z0000 Encounter for general adult medical examination without abnormal findings: Secondary | ICD-10-CM | POA: Diagnosis not present

## 2019-03-10 DIAGNOSIS — E039 Hypothyroidism, unspecified: Secondary | ICD-10-CM

## 2019-03-10 DIAGNOSIS — D649 Anemia, unspecified: Secondary | ICD-10-CM | POA: Diagnosis not present

## 2019-03-10 DIAGNOSIS — E782 Mixed hyperlipidemia: Secondary | ICD-10-CM

## 2019-03-10 DIAGNOSIS — Z1239 Encounter for other screening for malignant neoplasm of breast: Secondary | ICD-10-CM

## 2019-03-10 DIAGNOSIS — L578 Other skin changes due to chronic exposure to nonionizing radiation: Secondary | ICD-10-CM

## 2019-03-10 DIAGNOSIS — B001 Herpesviral vesicular dermatitis: Secondary | ICD-10-CM

## 2019-03-10 DIAGNOSIS — M79671 Pain in right foot: Secondary | ICD-10-CM

## 2019-03-10 LAB — COMPREHENSIVE METABOLIC PANEL
ALT: 10 U/L (ref 0–35)
AST: 14 U/L (ref 0–37)
Albumin: 4.1 g/dL (ref 3.5–5.2)
Alkaline Phosphatase: 81 U/L (ref 39–117)
BUN: 12 mg/dL (ref 6–23)
CO2: 31 mEq/L (ref 19–32)
Calcium: 9.6 mg/dL (ref 8.4–10.5)
Chloride: 105 mEq/L (ref 96–112)
Creatinine, Ser: 0.71 mg/dL (ref 0.40–1.20)
GFR: 84.65 mL/min (ref >60.00)
Glucose, Bld: 89 mg/dL (ref 70–99)
Potassium: 4.5 mEq/L (ref 3.5–5.1)
Sodium: 140 mEq/L (ref 135–145)
Total Bilirubin: 0.7 mg/dL (ref 0.2–1.2)
Total Protein: 6.4 g/dL (ref 6.0–8.3)

## 2019-03-10 LAB — CBC
HCT: 40.4 % (ref 36.0–46.0)
Hemoglobin: 13.5 g/dL (ref 12.0–15.0)
MCHC: 33.4 g/dL (ref 30.0–36.0)
MCV: 95.6 fl (ref 78.0–100.0)
Platelets: 197 10*3/uL (ref 150.0–400.0)
RBC: 4.22 Mil/uL (ref 3.87–5.11)
RDW: 13.2 % (ref 11.5–15.5)
WBC: 4.1 10*3/uL (ref 4.0–10.5)

## 2019-03-10 LAB — LIPID PANEL
Cholesterol: 189 mg/dL (ref 0–200)
HDL: 62 mg/dL (ref >39.00)
LDL Cholesterol: 116 mg/dL — ABNORMAL HIGH (ref 0–99)
NonHDL: 126.66
Total CHOL/HDL Ratio: 3
Triglycerides: 53 mg/dL (ref 0.0–149.0)
VLDL: 10.6 mg/dL (ref 0.0–40.0)

## 2019-03-10 LAB — TSH: TSH: 0.06 u[IU]/mL — ABNORMAL LOW (ref 0.35–4.50)

## 2019-03-10 MED ORDER — VALACYCLOVIR HCL 500 MG PO TABS
500.0000 mg | ORAL_TABLET | Freq: Every day | ORAL | 5 refills | Status: DC
Start: 2019-03-10 — End: 2022-05-17

## 2019-03-10 MED ORDER — LEVOTHYROXINE SODIUM 100 MCG PO TABS: 100.0000 ug | ORAL_TABLET | Freq: Every day | ORAL | 1 refills | Status: DC

## 2019-03-10 NOTE — Assessment & Plan Note (Signed)
Encouraged heart healthy diet, increase exercise, avoid trans fats, consider a krill oil cap daily 

## 2019-03-10 NOTE — Progress Notes (Signed)
Subjective:    Patient ID: Jordan Juarez, female    DOB: Jun 30, 1960, 59 y.o.   MRN: [id number]  Chief Complaint  Patient presents with  . Annual Exam    HPI Patient is in today for annual preventative exam. She has seen dermatology and two lesions on face removed and neither one are cancerous. She is having increased pain in right foot. Has been building slowly over years but is now starting to limit her ability to walk long distances the majority of the pain is shooting pain in the second toe which has become some deformed and over a bunion. No recent febrile illness or hospitalizations. She has been watching her grand baby several days a week but is otherwise staying home and taking care of herself. Exercises and walks regularly and tries to maintain a heart healthy diet. Denies CP/palp/SOB/HA/congestion/fevers/GI or GU c/o. Taking meds as prescribed  Past Medical History:  Diagnosis Date  . Allergic state 06/03/2012  . Anemia 06/03/2012  . Breast lesion on mammography 06/03/2012   S/p biopsy on left with marker left behind, benign  . Chicken pox as a child  . Hyperlipidemia, mixed 08/11/2015  . Hypothyroidism   . Overweight 08/08/2014  . PONV (postoperative nausea and vomiting)   . Preventative health care 06/03/2012  . Sun-damaged skin 06/03/2012  . Thyroid nodule 06/03/2012  . Unspecified hypothyroidism 06/03/2012    Past Surgical History:  Procedure Laterality Date  . ABDOMINAL HYSTERECTOMY  07-13-10   still has ovaries  . BREAST BIOPSY  2011  . BREAST SURGERY     left breast biopsies  . ENDOMETRIAL ABLATION  2009   done in office- n&v postop  . LAPAROSCOPIC ASSISTED VAGINAL HYSTERECTOMY  07/13/2010   Procedure: LAPAROSCOPIC ASSISTED VAGINAL HYSTERECTOMY;  Surgeon: Margarette Asal;  Location: [facility];  Service: Gynecology;  Laterality: N/A;  . MOLE REMOVAL Right    forehead, 4th grade    Family History  Problem Relation Age of Onset  . Heart attack Mother   . Emphysema Mother          smoker  . Thyroid disease Mother   . Hypertension Father   . Cancer Father        prostate cancer  . Dementia Father   . Leukemia Sister        acute/ smoker  . Thyroid disease Sister   . Depression Sister        serious MVA, chronic pain  . Cancer Maternal Grandmother        lung/ smoker  . Diabetes Maternal Grandmother        type 2  . Stroke Maternal Grandfather        several  . Hypertension Maternal Grandfather   . Cancer Paternal Grandfather        bone/ smoker  . Cancer Daughter        2 abnormal skin lesion excisions at age 48 first time  . Asthma Son   . Colon cancer Neg Hx   . Rectal cancer Neg Hx   . Stomach cancer Neg Hx     Social History   Socioeconomic History  . Marital status: Married    Spouse name: Not on file  . Number of children: Not on file  . Years of education: Not on file  . Highest education level: Not on file  Occupational History  . Not on file  Tobacco Use  . Smoking status: Never Smoker  . Smokeless tobacco: Never Used  Substance and Sexual Activity  . Alcohol use: Yes    Alcohol/week: 2.0 standard drinks    Types: 2 Glasses of wine per week    Comment: occ  . Drug use: No  . Sexual activity: Yes  Other Topics Concern  . Not on file  Social History Narrative  . Not on file   Social Determinants of Health   Financial Resource Strain:   . Difficulty of Paying Living Expenses: Not on file  Food Insecurity:   . Worried About Charity fundraiser in the Last Year: Not on file  . Ran Out of Food in the Last Year: Not on file  Transportation Needs:   . Lack of Transportation (Medical): Not on file  . Lack of Transportation (Non-Medical): Not on file  Physical Activity:   . Days of Exercise per Week: Not on file  . Minutes of Exercise per Session: Not on file  Stress:   . Feeling of Stress : Not on file  Social Connections:   . Frequency of Communication with Friends and Family: Not on file  . Frequency of Social  Gatherings with Friends and Family: Not on file  . Attends Religious Services: Not on file  . Active Member of Clubs or Organizations: Not on file  . Attends Archivist Meetings: Not on file  . Marital Status: Not on file  Intimate Partner Violence:   . Fear of Current or Ex-Partner: Not on file  . Emotionally Abused: Not on file  . Physically Abused: Not on file  . Sexually Abused: Not on file    Outpatient Medications Prior to Visit  Medication Sig Dispense Refill  . acetaminophen (TYLENOL) 500 MG tablet Take 500 mg by mouth every 6 (six) hours as needed for pain.    Marland Kitchen atovaquone-proguanil (MALARONE) 250-100 MG TABS tablet Take 1 tablet by mouth daily. 40 tablet 0  . cetirizine (ZYRTEC) 10 MG tablet Take 10 mg by mouth daily.    Marland Kitchen ibuprofen (ADVIL) 200 MG tablet Take 200 mg by mouth every 6 (six) hours as needed for pain.    Marland Kitchen levothyroxine (SYNTHROID) 112 MCG tablet TAKE 1 TABLET(112 MCG) BY MOUTH DAILY 90 tablet 1  . scopolamine (TRANSDERM-SCOP, 1.5 MG,) 1 MG/3DAYS Place 1 patch (1.5 mg total) onto the skin as needed. 10 patch 1  . typhoid (VIVOTIF) DR capsule Take 1 capsule by mouth every other day. 4 capsule 0  . valACYclovir (VALTREX) 500 MG tablet Take 1 tablet (500 mg total) by mouth daily. 30 tablet 5   No facility-administered medications prior to visit.    No Known Allergies  Review of Systems  Constitutional: Positive for malaise/fatigue. Negative for chills and fever.  HENT: Negative for congestion and hearing loss.   Eyes: Negative for discharge.  Respiratory: Negative for cough, sputum production and shortness of breath.   Cardiovascular: Negative for chest pain, palpitations and leg swelling.  Gastrointestinal: Negative for abdominal pain, blood in stool, constipation, diarrhea, heartburn, nausea and vomiting.  Genitourinary: Negative for dysuria, frequency, hematuria and urgency.  Musculoskeletal: Positive for joint pain. Negative for back pain, falls  and myalgias.  Skin: Negative for rash.  Neurological: Negative for dizziness, sensory change, loss of consciousness, weakness and headaches.  Endo/Heme/Allergies: Negative for environmental allergies. Does not bruise/bleed easily.  Psychiatric/Behavioral: Negative for depression, memory loss and suicidal ideas. The patient is not nervous/anxious and does not have insomnia.        Objective:    Physical Exam  Constitutional:      General: She is not in acute distress.    Appearance: She is not diaphoretic.  HENT:     Head: Normocephalic and atraumatic.     Right Ear: External ear normal.     Left Ear: External ear normal.     Nose: Nose normal.     Mouth/Throat:     Pharynx: No oropharyngeal exudate.  Eyes:     General: No scleral icterus.       Right eye: No discharge.        Left eye: No discharge.     Conjunctiva/sclera: Conjunctivae normal.     Pupils: Pupils are equal, round, and reactive to light.  Neck:     Thyroid: No thyromegaly.  Cardiovascular:     Rate and Rhythm: Normal rate and regular rhythm.     Heart sounds: Normal heart sounds. No murmur.  Pulmonary:     Effort: Pulmonary effort is normal. No respiratory distress.     Breath sounds: Normal breath sounds. No wheezing or rales.  Abdominal:     General: Bowel sounds are normal. There is no distension.     Palpations: Abdomen is soft. There is no mass.     Tenderness: There is no abdominal tenderness.  Musculoskeletal:        General: No tenderness. Normal range of motion.     Cervical back: Normal range of motion and neck supple.     Comments: righ foot second toe hyperflexed and early bunion swelling noted at first MTP joint medially  Lymphadenopathy:     Cervical: No cervical adenopathy.  Skin:    General: Skin is warm and dry.     Findings: No rash.  Neurological:     Mental Status: She is alert and oriented to person, place, and time.     Cranial Nerves: No cranial nerve deficit.     Coordination:  Coordination normal.     Deep Tendon Reflexes: Reflexes are normal and symmetric. Reflexes normal.     BP (!) 106/49 (BP Location: Left Arm, Cuff Size: Normal)   Pulse 63   Temp (!) 96.5 F (35.8 C) (Temporal)   Resp 12   Ht 5\' 4"  (1.626 m)   Wt 151 lb 12.8 oz (68.9 kg)   LMP 07/05/2010   SpO2 100%   BMI 26.06 kg/m  Wt Readings from Last 3 Encounters:  03/10/19 151 lb 12.8 oz (68.9 kg)  09/23/18 157 lb (71.2 kg)  03/11/17 145 lb 12.8 oz (66.1 kg)    Diabetic Foot Exam - Simple   No data filed     Lab Results  Component Value Date   WBC 4.6 12/16/2018   HGB 14.0 12/16/2018   HCT 42.4 12/16/2018   PLT 224.0 12/16/2018   GLUCOSE 78 12/16/2018   CHOL 202 (H) 12/16/2018   TRIG 67.0 12/16/2018   HDL 68.80 12/16/2018   LDLCALC 120 (H) 12/16/2018   ALT 12 12/16/2018   AST 16 12/16/2018   NA 141 12/16/2018   K 4.6 12/16/2018   CL 103 12/16/2018   CREATININE 0.74 12/16/2018   BUN 13 12/16/2018   CO2 32 12/16/2018   TSH 0.20 (L) 12/16/2018    Lab Results  Component Value Date   TSH 0.20 (L) 12/16/2018   Lab Results  Component Value Date   WBC 4.6 12/16/2018   HGB 14.0 12/16/2018   HCT 42.4 12/16/2018   MCV 97.4 12/16/2018   PLT 224.0 12/16/2018  Lab Results  Component Value Date   NA 141 12/16/2018   K 4.6 12/16/2018   CO2 32 12/16/2018   GLUCOSE 78 12/16/2018   BUN 13 12/16/2018   CREATININE 0.74 12/16/2018   BILITOT 0.5 12/16/2018   ALKPHOS 79 12/16/2018   AST 16 12/16/2018   ALT 12 12/16/2018   PROT 6.7 12/16/2018   ALBUMIN 4.4 12/16/2018   CALCIUM 9.9 12/16/2018   GFR 80.77 12/16/2018   Lab Results  Component Value Date   CHOL 202 (H) 12/16/2018   Lab Results  Component Value Date   HDL 68.80 12/16/2018   Lab Results  Component Value Date   LDLCALC 120 (H) 12/16/2018   Lab Results  Component Value Date   TRIG 67.0 12/16/2018   Lab Results  Component Value Date   CHOLHDL 3 12/16/2018   No results found for: HGBA1C       Assessment & Plan:   Problem List Items Addressed This Visit    Hypothyroidism    Has struggled the past few years to get TSH to stabilize. Will check TSH and free T4 today      Relevant Orders   Comprehensive metabolic panel   TSH   Preventative health care    Patient encouraged to maintain heart healthy diet, regular exercise, adequate sleep. Consider daily probiotics. Take medications as prescribed. Shingrix given today. Reminded not to take another shot within two weeks of the other. Warned regarding side effects. MGM ordered for screening purposes. Discuss idea of pelvic exam every 5 years or so despite history of hysterectomy given young age and good health. She will consider. Colonoscopy normal in 2014 repeat in 2024. Labs ordered and reviewed.       Relevant Orders   Comprehensive metabolic panel   Sun-damaged skin    Saw Dr Ubaldo Glassing this year and two lesions on face biopsied but no concerns identified.       Anemia   Relevant Orders   CBC   Comprehensive metabolic panel   Overweight    She is exercising, walking and trying to maintain a heart healthy diet.       Hyperlipidemia, mixed    Encouraged heart healthy diet, increase exercise, avoid trans fats, consider a krill oil cap daily      Relevant Orders   Comprehensive metabolic panel   Lipid panel   Cold sore    Has had several painful difficult to manage outbreaks recently. Will start her on suppressive therapy Valtrex 500 mg daily and reassess as needed       Relevant Medications   valACYclovir (VALTREX) 500 MG tablet   Right foot pain - Primary    Has been worsening over time and she has an early bunion and deformity of second toe with this being the majority of the discomfort. Especially with walking. She is referred to [facility] Dr Paulla Dolly for further evaluation and encouraged to ice and apply Lidocaine gel prn       Relevant Orders   Ambulatory referral to Podiatry    Other Visit Diagnoses    Encounter for  screening for malignant neoplasm of breast, unspecified screening modality       Relevant Orders   MM DIAG BREAST TOMO BILATERAL   Need for shingles vaccine       Relevant Orders   Varicella-zoster vaccine IM (Shingrix) (Completed)      I am having Jordan Juarez maintain her ibuprofen, cetirizine, acetaminophen, scopolamine, typhoid, atovaquone-proguanil, levothyroxine, and valACYclovir.  Meds  ordered this encounter  Medications  . valACYclovir (VALTREX) 500 MG tablet    Sig: Take 1 tablet (500 mg total) by mouth daily.    Dispense:  30 tablet    Refill:  5     Penni Homans, MD

## 2019-03-10 NOTE — Assessment & Plan Note (Signed)
Has struggled the past few years to get TSH to stabilize. Will check TSH and free T4 today

## 2019-03-10 NOTE — Assessment & Plan Note (Signed)
Saw Dr Ubaldo Glassing this year and two lesions on face biopsied but no concerns identified.

## 2019-03-10 NOTE — Assessment & Plan Note (Signed)
Has been worsening over time and she has an early bunion and deformity of second toe with this being the majority of the discomfort. Especially with walking. She is referred to [facility] Dr Paulla Dolly for further evaluation and encouraged to ice and apply Lidocaine gel prn

## 2019-03-10 NOTE — Assessment & Plan Note (Signed)
She is exercising, walking and trying to maintain a heart healthy diet.

## 2019-03-10 NOTE — Assessment & Plan Note (Signed)
Patient encouraged to maintain heart healthy diet, regular exercise, adequate sleep. Consider daily probiotics. Take medications as prescribed. Shingrix given today. Reminded not to take another shot within two weeks of the other. Warned regarding side effects. MGM ordered for screening purposes. Discuss idea of pelvic exam every 5 years or so despite history of hysterectomy given young age and good health. She will consider. Colonoscopy normal in 2014 repeat in 2024. Labs ordered and reviewed.

## 2019-03-10 NOTE — Assessment & Plan Note (Signed)
Has had several painful difficult to manage outbreaks recently. Will start her on suppressive therapy Valtrex 500 mg daily and reassess as needed

## 2019-03-10 NOTE — Patient Instructions (Signed)
Omron Blood Pressure cuff, upper arm, want BP 100-140/60-90 Pulse oximeter, want oxygen in 90s  Weekly vitals  Take Multivitamin with minerals, selenium Vitamin D 1000-2000 IU daily Probiotic with lactobacillus and bifidophilus Asprin EC 81 mg daily  Melatonin 2-5 mg at bedtime  [email or web address]  The mRNA technology has been in development for 20 years and we already had the Coronavirus family of viruses (which usually just cause the common cold) genetically mapped already which is why we were able to come up with viable vaccine candidates so quickly in stage 1, then stage 2 scientifically took the correct amount of time what we did to speed it up was just build the manufacturing platform at the same time we were running the experiments so if it worked we could produce faster. And stage 3 has now had many months and millions of people immunized and we are seeing the immunity hold for over 9 months now with sign of it dissipating and no significant numbers of adverse reactions.  During every flu season we see 2 anaphylactic reactions for every million shots given and we initially thought we would see 11 per million with the COVID vaccine but now we see only 2-3 with Moderna and 5 or so with [place] so compared to someone is dying every 20 minutes from [place] and more deadly and infectious strains are coming it is definitely best when weighing the risks and benefits to take the shots.  Another pooled analysis of the 5 most utilized vaccines in the world shows that after full immunization so far no one has died from [place].

## 2019-03-11 ENCOUNTER — Other Ambulatory Visit: Payer: Self-pay | Admitting: Emergency Medicine

## 2019-03-11 ENCOUNTER — Other Ambulatory Visit: Payer: Self-pay | Admitting: Family Medicine

## 2019-03-11 ENCOUNTER — Other Ambulatory Visit: Payer: 59

## 2019-03-11 DIAGNOSIS — E039 Hypothyroidism, unspecified: Secondary | ICD-10-CM | POA: Diagnosis not present

## 2019-03-11 DIAGNOSIS — D649 Anemia, unspecified: Secondary | ICD-10-CM

## 2019-03-11 DIAGNOSIS — E782 Mixed hyperlipidemia: Secondary | ICD-10-CM

## 2019-03-11 NOTE — Progress Notes (Signed)
tsh

## 2019-03-11 NOTE — Progress Notes (Signed)
thy

## 2019-03-12 LAB — T4, FREE: Free T4: 1.9 ng/dL — ABNORMAL HIGH (ref 0.8–1.8)

## 2019-03-12 LAB — THYROID PEROXIDASE ANTIBODY: Thyroperoxidase Ab SerPl-aCnc: <1 IU/mL (ref <9)

## 2019-04-08 ENCOUNTER — Other Ambulatory Visit: Payer: Self-pay

## 2019-04-08 ENCOUNTER — Ambulatory Visit
Admission: RE | Admit: 2019-04-08 | Discharge: 2019-04-08 | Disposition: A | Discharge status: Home / Self Care | Payer: 59 | Source: Ambulatory Visit | Attending: Family Medicine | Admitting: Family Medicine

## 2019-04-08 DIAGNOSIS — Z1231 Encounter for screening mammogram for malignant neoplasm of breast: Secondary | ICD-10-CM | POA: Diagnosis not present

## 2019-04-08 DIAGNOSIS — Z1239 Encounter for other screening for malignant neoplasm of breast: Secondary | ICD-10-CM

## 2019-05-11 ENCOUNTER — Ambulatory Visit (INDEPENDENT_AMBULATORY_CARE_PROVIDER_SITE_OTHER): Payer: 59 | Admitting: Podiatry

## 2019-05-11 ENCOUNTER — Ambulatory Visit (INDEPENDENT_AMBULATORY_CARE_PROVIDER_SITE_OTHER): Payer: 59

## 2019-05-11 ENCOUNTER — Other Ambulatory Visit: Payer: Self-pay

## 2019-05-11 ENCOUNTER — Encounter: Payer: Self-pay | Admitting: Podiatry

## 2019-05-11 VITALS — BP 126/105 | HR 65 | Temp 98.0°F | Resp 16

## 2019-05-11 DIAGNOSIS — M79671 Pain in right foot: Secondary | ICD-10-CM

## 2019-05-11 DIAGNOSIS — M779 Enthesopathy, unspecified: Secondary | ICD-10-CM | POA: Diagnosis not present

## 2019-05-11 NOTE — Progress Notes (Signed)
   Subjective:    Patient ID: Jordan Juarez, female    DOB: 1960-11-12, 59 y.o.   MRN: [id number]  HPI    Review of Systems  All other systems reviewed and are negative.      Objective:   Physical Exam        Assessment & Plan:

## 2019-05-12 NOTE — Progress Notes (Signed)
Subjective:   Patient ID: Jordan Juarez, female   DOB: 59 y.o.   MRN: [id number]   HPI Patient presents stating she is had a lot of pain around her second joint and feels nerve pain also.  States been going on for a number of years and seems worse over the last year or 2 and she likes to hike and be active and it does affect those types of activities after periods of time.  Patient does not smoke and does like to be active   Review of Systems  All other systems reviewed and are negative.       Objective:  Physical Exam Vitals and nursing note reviewed.  Constitutional:      Appearance: She is well-developed.  Pulmonary:     Effort: Pulmonary effort is normal.  Musculoskeletal:        General: Normal range of motion.  Skin:    General: Skin is warm.  Neurological:     Mental Status: She is alert.     Neurovascular status was found to be intact muscle strength was found to be adequate with range of motion within normal limits.  Patient was noted to have moderate swelling and thickness around the second metatarsal phalangeal joint and mildly into the third with reduced range of motion noted around the joint surface.  The joint surface is quite painful when pressed with inflammation and there is dorsal enlargement around this area.  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  We will get a try conservative treatment even though there is bone changes and decide what type of response she gets and that will guide what needs to be done in the future.  Today I did a proximal nerve block of the area with 60 mg like Marcaine mixture sterile prep applied and using sterile instrumentation I aspirated the second MPJ getting on a small amount of clear fluid and injected with quarter cc  of dexamethasone Kenalog into the joint and applied thick padding to diffuse pressure off the joint surface.  Discussed rigid bottom shoes and reappoint for [place] to recheck again in the next several weeks to gauge response and to decide what would be appropriate next.  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

## 2019-05-13 ENCOUNTER — Ambulatory Visit: Payer: Self-pay | Admitting: Podiatry

## 2019-05-18 ENCOUNTER — Encounter: Payer: Self-pay | Admitting: Family Medicine

## 2019-05-18 ENCOUNTER — Other Ambulatory Visit: Payer: Self-pay

## 2019-05-18 ENCOUNTER — Ambulatory Visit: Payer: Self-pay

## 2019-05-18 ENCOUNTER — Ambulatory Visit (INDEPENDENT_AMBULATORY_CARE_PROVIDER_SITE_OTHER)
Admission: RE | Admit: 2019-05-18 | Discharge: 2019-05-18 | Disposition: A | Discharge status: Home / Self Care | Payer: 59 | Source: Ambulatory Visit | Attending: Family Medicine | Admitting: Family Medicine

## 2019-05-18 ENCOUNTER — Ambulatory Visit (INDEPENDENT_AMBULATORY_CARE_PROVIDER_SITE_OTHER): Payer: 59 | Admitting: Family Medicine

## 2019-05-18 VITALS — BP 126/80 | HR 68 | Ht 64.0 in | Wt 148.0 lb

## 2019-05-18 DIAGNOSIS — M1712 Unilateral primary osteoarthritis, left knee: Secondary | ICD-10-CM | POA: Diagnosis not present

## 2019-05-18 DIAGNOSIS — S83242A Other tear of medial meniscus, current injury, left knee, initial encounter: Secondary | ICD-10-CM

## 2019-05-18 DIAGNOSIS — M25562 Pain in left knee: Secondary | ICD-10-CM

## 2019-05-18 DIAGNOSIS — M25561 Pain in right knee: Secondary | ICD-10-CM | POA: Insufficient documentation

## 2019-05-18 MED ORDER — MELOXICAM 15 MG PO TABS
15.0000 mg | ORAL_TABLET | Freq: Every day | ORAL | 0 refills | Status: DC
Start: 2019-05-18 — End: 2019-06-15

## 2019-05-18 NOTE — Patient Instructions (Signed)
Meloxicam for next 10 days Exercises Brace with activity See me in 2 weeks

## 2019-05-18 NOTE — Assessment & Plan Note (Signed)
Medial meniscal tear noted.  Discussed with patient about icing regimen and home exercise, which activities to do which wants to avoid.  Patient is to wear a brace secondary to the instability.  Patient does have some laxity of the ACL and may need the possibility of an MRI at follow-up.  Hopefully the patient does fairly well with conservative therapy.  No significant swelling noted.  X-rays pending.  Meloxicam prescribed

## 2019-05-18 NOTE — Progress Notes (Addendum)
[facility] Phone: [phone] Subjective:   Fontaine No, am serving as a scribe for Dr. Hulan Saas. This visit occurred during the SARS-CoV-2 public health emergency.  Safety protocols were in place, including screening questions prior to the visit, additional usage of staff PPE, and extensive cleaning of exam room while observing appropriate contact time as indicated for disinfecting solutions.   I'm seeing this patient by the request  of:  Mosie Lukes, MD  CC: knee pain   [id number]  Jordan Juarez is a 59 y.o. female coming in with complaint of leftkneepain. Two weeks ago patient ran into gym and stopped suddenly at the door. Felt a pop in the knee and had a hard time bearing weight. Pain over posterior aspect of knee. Has been able to go to the gym to do elliptical. Is able to walk one mile and then pain causes her to limp. Unstable feeling.     Past Medical History:  Diagnosis Date  . Allergic state 06/03/2012  . Anemia 06/03/2012  . Breast lesion on mammography 06/03/2012   S/p biopsy on left with marker left behind, benign  . Chicken pox as a child  . Hyperlipidemia, mixed 08/11/2015  . Hypothyroidism   . Overweight 08/08/2014  . PONV (postoperative nausea and vomiting)   . Preventative health care 06/03/2012  . Sun-damaged skin 06/03/2012  . Thyroid nodule 06/03/2012  . Unspecified hypothyroidism 06/03/2012   Past Surgical History:  Procedure Laterality Date  . ABDOMINAL HYSTERECTOMY  07-13-10   still has ovaries  . BREAST BIOPSY  2011  . BREAST SURGERY     left breast biopsies  . ENDOMETRIAL ABLATION  2009   done in office- n&v postop  . LAPAROSCOPIC ASSISTED VAGINAL HYSTERECTOMY  07/13/2010   Procedure: LAPAROSCOPIC ASSISTED VAGINAL HYSTERECTOMY;  Surgeon: Margarette Asal;  Location: [facility];  Service: Gynecology;  Laterality: N/A;  . MOLE REMOVAL Right    forehead, 4th grade   Social History    Socioeconomic History  . Marital status: Married    Spouse name: Not on file  . Number of children: Not on file  . Years of education: Not on file  . Highest education level: Not on file  Occupational History  . Not on file  Tobacco Use  . Smoking status: Never Smoker  . Smokeless tobacco: Never Used  Substance and Sexual Activity  . Alcohol use: Yes    Alcohol/week: 2.0 standard drinks    Types: 2 Glasses of wine per week    Comment: occ  . Drug use: No  . Sexual activity: Yes  Other Topics Concern  . Not on file  Social History Narrative  . Not on file   Social Determinants of Health   Financial Resource Strain:   . Difficulty of Paying Living Expenses:   Food Insecurity:   . Worried About Charity fundraiser in the Last Year:   . Arboriculturist in the Last Year:   Transportation Needs:   . Film/video editor (Medical):   Marland Kitchen Lack of Transportation (Non-Medical):   Physical Activity:   . Days of Exercise per Week:   . Minutes of Exercise per Session:   Stress:   . Feeling of Stress :   Social Connections:   . Frequency of Communication with Friends and Family:   . Frequency of Social Gatherings with Friends and Family:   . Attends Religious Services:   .  Active Member of Clubs or Organizations:   . Attends Archivist Meetings:   Marland Kitchen Marital Status:    Allergies  Allergen Reactions  . Other     Advocado - pt stated, "Made me nausea and vomitting"   Family History  Problem Relation Age of Onset  . Heart attack Mother   . Emphysema Mother        smoker  . Thyroid disease Mother   . Hypertension Father   . Cancer Father        prostate cancer  . Dementia Father   . Leukemia Sister        acute/ smoker  . Thyroid disease Sister   . Depression Sister        serious MVA, chronic pain  . Cancer Maternal Grandmother        lung/ smoker  . Diabetes Maternal Grandmother        type 2  . Stroke Maternal Grandfather        several  .  Hypertension Maternal Grandfather   . Cancer Paternal Grandfather        bone/ smoker  . Cancer Daughter        2 abnormal skin lesion excisions at age 62 first time  . Asthma Son   . Colon cancer Neg Hx   . Rectal cancer Neg Hx   . Stomach cancer Neg Hx     Current Outpatient Medications (Endocrine & Metabolic):  .  levothyroxine (SYNTHROID) 100 MCG tablet, Take 1 tablet (100 mcg total) by mouth daily.   Current Outpatient Medications (Respiratory):  .  cetirizine (ZYRTEC) 10 MG tablet, Take 10 mg by mouth daily.  Current Outpatient Medications (Analgesics):  .  acetaminophen (TYLENOL) 500 MG tablet, Take 500 mg by mouth every 6 (six) hours as needed for pain. Marland Kitchen  ibuprofen (ADVIL) 200 MG tablet, Take 200 mg by mouth every 6 (six) hours as needed for pain. .  meloxicam (MOBIC) 15 MG tablet, Take 1 tablet (15 mg total) by mouth daily.   Current Outpatient Medications (Other):  .  atovaquone-proguanil (MALARONE) 250-100 MG TABS tablet, Take 1 tablet by mouth daily. Marland Kitchen  scopolamine (TRANSDERM-SCOP, 1.5 MG,) 1 MG/3DAYS, Place 1 patch (1.5 mg total) onto the skin as needed. .  typhoid (VIVOTIF) DR capsule, Take 1 capsule by mouth every other day. .  valACYclovir (VALTREX) 500 MG tablet, Take 1 tablet (500 mg total) by mouth daily.   Reviewed prior external information including notes and imaging from  primary care provider As well as notes that were available from care everywhere and other healthcare systems.  Past medical history, social, surgical and family history all reviewed in electronic medical record.  No pertanent information unless stated regarding to the chief complaint.   Review of Systems:  No headache, visual changes, nausea, vomiting, diarrhea, constipation, dizziness, abdominal pain, skin rash, fevers, chills, night sweats, weight loss, swollen lymph nodes, body aches, joint swelling, chest pain, shortness of breath, mood changes. POSITIVE muscle aches  Objective    Blood pressure 126/80, pulse 68, height 5\' 4"  (1.626 m), weight 148 lb (67.1 kg), last menstrual period 07/05/2010, SpO2 98 %.   General: No apparent distress alert and oriented x3 mood and affect normal, dressed appropriately.  HEENT: Pupils equal, extraocular movements intact  Respiratory: Patient's speak in full sentences and does not appear short of breath  Cardiovascular: No lower extremity edema, non tender, no erythema  Neuro: Cranial nerves II through XII are intact,  neurovascularly intact in all extremities with 2+ DTRs and 2+ pulses.  Gait antalgic MSK: Left knee shows the patient does have some mild laxity noted of the ACL but there is somewhat of an endpoint.  Positive McMurray's that is fairly severe.  Patient does have mild patella grind test noted as well.  Limited musculoskeletal ultrasound was performed and interpreted by Lyndal Pulley  Limited ultrasound of patient's knee shows the patient does have a large medial meniscal tear with displacement noted.  Greater than 50% of the meniscus of pain appears to be involved.  No true lateral side involvement.  No effusion noted.  Moderate narrowing of the patellofemoral joint   Impression and Recommendations:     Patient was measured and fitted for an off-the-shelf brace. Adjustments were made to brace to ensure proper fit.    This case required medical decision making of moderate complexity. The above documentation has been reviewed and is accurate and complete Lyndal Pulley, DO       Note: This dictation was prepared with Dragon dictation along with smaller phrase technology. Any transcriptional errors that result from this process are unintentional.

## 2019-05-19 ENCOUNTER — Encounter: Payer: Self-pay | Admitting: Family Medicine

## 2019-05-20 ENCOUNTER — Other Ambulatory Visit: Payer: Self-pay

## 2019-05-20 DIAGNOSIS — S83242A Other tear of medial meniscus, current injury, left knee, initial encounter: Secondary | ICD-10-CM

## 2019-06-04 ENCOUNTER — Other Ambulatory Visit: Payer: Self-pay

## 2019-06-04 ENCOUNTER — Ambulatory Visit
Admission: RE | Admit: 2019-06-04 | Discharge: 2019-06-04 | Disposition: A | Discharge status: Home / Self Care | Payer: 59 | Source: Ambulatory Visit | Attending: Family Medicine | Admitting: Family Medicine

## 2019-06-04 DIAGNOSIS — M23322 Other meniscus derangements, posterior horn of medial meniscus, left knee: Secondary | ICD-10-CM | POA: Diagnosis not present

## 2019-06-04 DIAGNOSIS — S83242A Other tear of medial meniscus, current injury, left knee, initial encounter: Secondary | ICD-10-CM

## 2019-06-08 ENCOUNTER — Other Ambulatory Visit: Payer: Self-pay

## 2019-06-08 ENCOUNTER — Encounter: Payer: Self-pay | Admitting: Podiatry

## 2019-06-08 ENCOUNTER — Telehealth: Payer: Self-pay | Admitting: Family Medicine

## 2019-06-08 ENCOUNTER — Ambulatory Visit (INDEPENDENT_AMBULATORY_CARE_PROVIDER_SITE_OTHER): Payer: 59 | Admitting: Podiatry

## 2019-06-08 DIAGNOSIS — M779 Enthesopathy, unspecified: Secondary | ICD-10-CM

## 2019-06-08 DIAGNOSIS — G8929 Other chronic pain: Secondary | ICD-10-CM

## 2019-06-08 DIAGNOSIS — M79671 Pain in right foot: Secondary | ICD-10-CM

## 2019-06-08 NOTE — Telephone Encounter (Signed)
Patient called asking if a referral to a surgeon could be sent in response to seeing her MRI results. She asked if she could be referred to either Dr Lara Mulch or Dr Gaynelle Arabian.

## 2019-06-08 NOTE — Telephone Encounter (Signed)
Referral sent. Patient notified via [facility]

## 2019-06-08 NOTE — Progress Notes (Signed)
F/o note:  Full length 38m deep heel seat, offload 2nd right as indicated in scan, small met pads b/l.

## 2019-06-08 NOTE — Progress Notes (Signed)
Subjective:   Patient ID: Jordan Juarez, female   DOB: 59 y.o.   MRN: [id number]   HPI Patient presents stating that the right foot is feeling quite a bit better but knows that she still has arthritis in the joint and at the left knee has had a torn meniscus and she will probably need surgery and is wearing a brace   ROS      Objective:  Physical Exam  Knee neurovascular status intact with discomfort still in the second MPJ right with inflammation fluid around the joint and indications of arthritis and spurring     Assessment:  Continue capsulitis right second MPJ with arthritis which is improved some but still present     Plan:  H&P reviewed condition and recommended orthotics to try to offload some weight.  Explained the theory as she wants to have these done and ped orthotist went ahead casted her today to offload the second metatarsophalangeal joint

## 2019-06-09 ENCOUNTER — Other Ambulatory Visit: Payer: Self-pay

## 2019-06-09 DIAGNOSIS — G8929 Other chronic pain: Secondary | ICD-10-CM

## 2019-06-09 DIAGNOSIS — S83242A Other tear of medial meniscus, current injury, left knee, initial encounter: Secondary | ICD-10-CM

## 2019-06-14 ENCOUNTER — Other Ambulatory Visit: Payer: Self-pay | Admitting: Family Medicine

## 2019-06-15 ENCOUNTER — Encounter: Payer: Self-pay | Admitting: Family Medicine

## 2019-06-15 ENCOUNTER — Ambulatory Visit (INDEPENDENT_AMBULATORY_CARE_PROVIDER_SITE_OTHER): Payer: 59 | Admitting: Family Medicine

## 2019-06-15 ENCOUNTER — Other Ambulatory Visit: Payer: Self-pay

## 2019-06-15 DIAGNOSIS — S83242A Other tear of medial meniscus, current injury, left knee, initial encounter: Secondary | ICD-10-CM | POA: Diagnosis not present

## 2019-06-15 NOTE — Patient Instructions (Addendum)
Good to see you Call [facility] Dr. Rhona Raider is my other suggestion Have fun in [place] I am here for any questions

## 2019-06-15 NOTE — Assessment & Plan Note (Signed)
Meniscal tear was noted as well.  Patient does have a couple areas of grade IV chondromalacia as well that we will need to monitor.  At this time patient will be referred to orthopedic surgery which we have done already but will try again.  Hopefully they can likely do an arthroscopic procedure to help with the meniscus and we will monitor the patellofemoral with the potential for custom bracing long-term.  All patient questions were answered today follow-up as needed

## 2019-06-15 NOTE — Progress Notes (Signed)
[facility] [address] [place] Phone: [phone] Subjective:   I Jordan Juarez am serving as a Education administrator for Dr. Hulan Saas.  This visit occurred during the SARS-CoV-2 public health emergency.  Safety protocols were in place, including screening questions prior to the visit, additional usage of staff PPE, and extensive cleaning of exam room while observing appropriate contact time as indicated for disinfecting solutions.  I'm seeing this patient by the request  of:  Mosie Lukes, MD  CC: Knee pain follow-up  [id number]   05/18/2019 Medial meniscal tear noted.  Discussed with patient about icing regimen and home exercise, which activities to do which wants to avoid.  Patient is to wear a brace secondary to the instability.  Patient does have some laxity of the ACL and may need the possibility of an MRI at follow-up.  Hopefully the patient does fairly well with conservative therapy.  No significant swelling noted.  X-rays pending.  Meloxicam prescribed  Update 06/15/2019 Jordan Juarez is a 59 y.o. female coming in with complaint of left knee pain. Patient states she feels unstable. Not in a lot of pain. Wearing the brace all the time.  Due to the instability of the knee patient was sent for an MRI.  MRI did show a complete radial tear of the posterior horn of the medial meniscus and extensive full-thickness cartilage loss involving the lateral patellofemoral compartment     Past Medical History:  Diagnosis Date  . Allergic state 06/03/2012  . Anemia 06/03/2012  . Breast lesion on mammography 06/03/2012   S/p biopsy on left with marker left behind, benign  . Chicken pox as a child  . Hyperlipidemia, mixed 08/11/2015  . Hypothyroidism   . Overweight 08/08/2014  . PONV (postoperative nausea and vomiting)   . Preventative health care 06/03/2012  . Sun-damaged skin 06/03/2012  . Thyroid nodule 06/03/2012  . Unspecified hypothyroidism 06/03/2012     Past Surgical History:  Procedure Laterality Date  . ABDOMINAL HYSTERECTOMY  07-13-10   still has ovaries  . BREAST BIOPSY  2011  . BREAST SURGERY     left breast biopsies  . ENDOMETRIAL ABLATION  2009   done in office- n&v postop  . LAPAROSCOPIC ASSISTED VAGINAL HYSTERECTOMY  07/13/2010   Procedure: LAPAROSCOPIC ASSISTED VAGINAL HYSTERECTOMY;  Surgeon: Margarette Asal;  Location: [facility];  Service: Gynecology;  Laterality: N/A;  . MOLE REMOVAL Right    forehead, 4th grade   Social History   Socioeconomic History  . Marital status: Married    Spouse name: Not on file  . Number of children: Not on file  . Years of education: Not on file  . Highest education level: Not on file  Occupational History  . Not on file  Tobacco Use  . Smoking status: Never Smoker  . Smokeless tobacco: Never Used  Vaping Use  . Vaping Use: Never used  Substance and Sexual Activity  . Alcohol use: Yes    Alcohol/week: 2.0 standard drinks    Types: 2 Glasses of wine per week    Comment: occ  . Drug use: No  . Sexual activity: Yes  Other Topics Concern  . Not on file  Social History Narrative  . Not on file   Social Determinants of Health   Financial Resource Strain:   . Difficulty of Paying Living Expenses:   Food Insecurity:   . Worried About Charity fundraiser in the Last Year:   .  Ran Out of Food in the Last Year:   Transportation Needs:   . Film/video editor (Medical):   Marland Kitchen Lack of Transportation (Non-Medical):   Physical Activity:   . Days of Exercise per Week:   . Minutes of Exercise per Session:   Stress:   . Feeling of Stress :   Social Connections:   . Frequency of Communication with Friends and Family:   . Frequency of Social Gatherings with Friends and Family:   . Attends Religious Services:   . Active Member of Clubs or Organizations:   . Attends Archivist Meetings:   Marland Kitchen Marital Status:    Allergies  Allergen Reactions  . Other     Advocado - pt  stated, "Made me nausea and vomitting"   Family History  Problem Relation Age of Onset  . Heart attack Mother   . Emphysema Mother        smoker  . Thyroid disease Mother   . Hypertension Father   . Cancer Father        prostate cancer  . Dementia Father   . Leukemia Sister        acute/ smoker  . Thyroid disease Sister   . Depression Sister        serious MVA, chronic pain  . Cancer Maternal Grandmother        lung/ smoker  . Diabetes Maternal Grandmother        type 2  . Stroke Maternal Grandfather        several  . Hypertension Maternal Grandfather   . Cancer Paternal Grandfather        bone/ smoker  . Cancer Daughter        2 abnormal skin lesion excisions at age 68 first time  . Asthma Son   . Colon cancer Neg Hx   . Rectal cancer Neg Hx   . Stomach cancer Neg Hx     Current Outpatient Medications (Endocrine & Metabolic):  .  levothyroxine (SYNTHROID) 100 MCG tablet, Take 1 tablet (100 mcg total) by mouth daily.   Current Outpatient Medications (Respiratory):  .  cetirizine (ZYRTEC) 10 MG tablet, Take 10 mg by mouth daily.  Current Outpatient Medications (Analgesics):  .  acetaminophen (TYLENOL) 500 MG tablet, Take 500 mg by mouth every 6 (six) hours as needed for pain. Marland Kitchen  ibuprofen (ADVIL) 200 MG tablet, Take 200 mg by mouth every 6 (six) hours as needed for pain. .  meloxicam (MOBIC) 15 MG tablet, TAKE 1 TABLET(15 MG) BY MOUTH DAILY   Current Outpatient Medications (Other):  .  atovaquone-proguanil (MALARONE) 250-100 MG TABS tablet, Take 1 tablet by mouth daily. Marland Kitchen  scopolamine (TRANSDERM-SCOP, 1.5 MG,) 1 MG/3DAYS, Place 1 patch (1.5 mg total) onto the skin as needed. .  typhoid (VIVOTIF) DR capsule, Take 1 capsule by mouth every other day. .  valACYclovir (VALTREX) 500 MG tablet, Take 1 tablet (500 mg total) by mouth daily.   Reviewed prior external information including notes and imaging from  primary care provider As well as notes that were available  from care everywhere and other healthcare systems.  Past medical history, social, surgical and family history all reviewed in electronic medical record.  No pertanent information unless stated regarding to the chief complaint.   Review of Systems:  No headache, visual changes, nausea, vomiting, diarrhea, constipation, dizziness, abdominal pain, skin rash, fevers, chills, night sweats, weight loss, swollen lymph nodes, body aches, joint swelling, chest pain, shortness of  breath, mood changes. POSITIVE muscle aches  Objective  Blood pressure 100/60, pulse 65, height 5\' 4"  (1.626 m), weight 147 lb (66.7 kg), last menstrual period 07/05/2010, SpO2 97 %.   General: No apparent distress alert and oriented x3 mood and affect normal, dressed appropriately.  HEENT: Pupils equal, extraocular movements intact  Respiratory: Patient's speak in full sentences and does not appear short of breath  Cardiovascular: No lower extremity edema, non tender, no erythema  Neuro: Cranial nerves II through XII are intact, neurovascularly intact in all extremities with 2+ DTRs and 2+ pulses.  Gait antalgic Left knee exam still has a positive patellar grind test.  Improvement in the amount of swelling that was noted previously.  Positive McMurray's and still tenderness over the medial joint line   Impression and Recommendations:     The above documentation has been reviewed and is accurate and complete Lyndal Pulley, DO       Note: This dictation was prepared with Dragon dictation along with smaller phrase technology. Any transcriptional errors that result from this process are unintentional.

## 2019-06-17 ENCOUNTER — Telehealth: Payer: Self-pay | Admitting: Family Medicine

## 2019-06-17 NOTE — Telephone Encounter (Signed)
[facility] called pt, but cannot see her until August. Wondering if she would have a better opportunity if we referred to [facility] as she does not want to wait this long.

## 2019-06-17 NOTE — Telephone Encounter (Signed)
Sent patient MyChart message.

## 2019-06-26 DIAGNOSIS — M1712 Unilateral primary osteoarthritis, left knee: Secondary | ICD-10-CM | POA: Diagnosis not present

## 2019-06-26 DIAGNOSIS — S83242D Other tear of medial meniscus, current injury, left knee, subsequent encounter: Secondary | ICD-10-CM | POA: Diagnosis not present

## 2019-06-29 ENCOUNTER — Ambulatory Visit: Payer: 59 | Admitting: Orthotics

## 2019-06-29 ENCOUNTER — Other Ambulatory Visit: Payer: Self-pay

## 2019-06-29 DIAGNOSIS — M779 Enthesopathy, unspecified: Secondary | ICD-10-CM

## 2019-06-29 NOTE — Progress Notes (Signed)
Patient came in today to pick up custom made foot orthotics.  The goals were accomplished and the patient reported no dissatisfaction with said orthotics.  Patient was advised of breakin period and how to report any issues. 

## 2019-07-07 DIAGNOSIS — M23322 Other meniscus derangements, posterior horn of medial meniscus, left knee: Secondary | ICD-10-CM | POA: Diagnosis not present

## 2019-07-07 DIAGNOSIS — M958 Other specified acquired deformities of musculoskeletal system: Secondary | ICD-10-CM | POA: Diagnosis not present

## 2019-07-07 DIAGNOSIS — G8918 Other acute postprocedural pain: Secondary | ICD-10-CM | POA: Diagnosis not present

## 2019-08-20 DIAGNOSIS — D225 Melanocytic nevi of trunk: Secondary | ICD-10-CM | POA: Diagnosis not present

## 2019-08-20 DIAGNOSIS — L812 Freckles: Secondary | ICD-10-CM | POA: Diagnosis not present

## 2019-08-20 DIAGNOSIS — D1801 Hemangioma of skin and subcutaneous tissue: Secondary | ICD-10-CM | POA: Diagnosis not present

## 2019-08-20 DIAGNOSIS — L814 Other melanin hyperpigmentation: Secondary | ICD-10-CM | POA: Diagnosis not present

## 2019-09-07 ENCOUNTER — Other Ambulatory Visit: Payer: Self-pay | Admitting: Family Medicine

## 2019-09-11 DIAGNOSIS — H11432 Conjunctival hyperemia, left eye: Secondary | ICD-10-CM | POA: Diagnosis not present

## 2019-09-11 DIAGNOSIS — S0502XA Injury of conjunctiva and corneal abrasion without foreign body, left eye, initial encounter: Secondary | ICD-10-CM | POA: Diagnosis not present

## 2019-09-21 ENCOUNTER — Other Ambulatory Visit: Payer: Self-pay

## 2019-09-21 ENCOUNTER — Ambulatory Visit: Payer: 59 | Admitting: Family Medicine

## 2019-09-21 VITALS — BP 118/74 | HR 64 | Temp 98.1°F | Resp 13 | Ht 64.0 in | Wt 147.8 lb

## 2019-09-21 DIAGNOSIS — D649 Anemia, unspecified: Secondary | ICD-10-CM

## 2019-09-21 DIAGNOSIS — L578 Other skin changes due to chronic exposure to nonionizing radiation: Secondary | ICD-10-CM | POA: Diagnosis not present

## 2019-09-21 DIAGNOSIS — E782 Mixed hyperlipidemia: Secondary | ICD-10-CM | POA: Diagnosis not present

## 2019-09-21 DIAGNOSIS — Z0000 Encounter for general adult medical examination without abnormal findings: Secondary | ICD-10-CM

## 2019-09-21 DIAGNOSIS — E039 Hypothyroidism, unspecified: Secondary | ICD-10-CM | POA: Diagnosis not present

## 2019-09-21 DIAGNOSIS — M25561 Pain in right knee: Secondary | ICD-10-CM

## 2019-09-21 NOTE — Assessment & Plan Note (Signed)
On Levothyroxine, continue to monitor 

## 2019-09-21 NOTE — Patient Instructions (Signed)

## 2019-09-21 NOTE — Assessment & Plan Note (Signed)
Continue with dermatology annually

## 2019-09-21 NOTE — Assessment & Plan Note (Signed)
Had surgery on right knee back in July due to a medical meniscal tear. By Dr Maureen Ralphs

## 2019-09-21 NOTE — Assessment & Plan Note (Signed)
Patient encouraged to maintain heart healthy diet, regular exercise, adequate sleep. Consider daily probiotics. Take medications as prescribed. Labs reviewed and ordered. Colonoscopy due in 2024, mgm was April 2021 repeat in August 2022. She agrees to go to pharmacy today for Shingrix and flu shot

## 2019-09-21 NOTE — Progress Notes (Signed)
Subjective:    Patient ID: Jordan Juarez, female    DOB: Jun 17, 1960, 59 y.o.   MRN: [id number]  Chief Complaint  Patient presents with  . 6 month followup    HPI Patient is in today for annual preventative exam and follow up on chronic medical concerns. She is doing well and denies any recent febrile illness or hospitalizations. No polyuria or polydipsia. She istrying to eat well and stay active with plenty of walking. She is trying to maintain social distancing, masking and she took the vaccine. Denies CP/palp/SOB/HA/congestion/fevers/GI or GU c/o. Taking meds as prescribed  Past Medical History:  Diagnosis Date  . Allergic state 06/03/2012  . Anemia 06/03/2012  . Breast lesion on mammography 06/03/2012   S/p biopsy on left with marker left behind, benign  . Chicken pox as a child  . Hyperlipidemia, mixed 08/11/2015  . Hypothyroidism   . Overweight 08/08/2014  . PONV (postoperative nausea and vomiting)   . Preventative health care 06/03/2012  . Sun-damaged skin 06/03/2012  . Thyroid nodule 06/03/2012  . Unspecified hypothyroidism 06/03/2012    Past Surgical History:  Procedure Laterality Date  . ABDOMINAL HYSTERECTOMY  07-13-10   still has ovaries  . BREAST BIOPSY  2011  . BREAST SURGERY     left breast biopsies  . ENDOMETRIAL ABLATION  2009   done in office- n&v postop  . LAPAROSCOPIC ASSISTED VAGINAL HYSTERECTOMY  07/13/2010   Procedure: LAPAROSCOPIC ASSISTED VAGINAL HYSTERECTOMY;  Surgeon: Margarette Asal;  Location: [facility];  Service: Gynecology;  Laterality: N/A;  . MOLE REMOVAL Right    forehead, 4th grade    Family History  Problem Relation Age of Onset  . Heart attack Mother   . Emphysema Mother        smoker  . Thyroid disease Mother   . Hypertension Father   . Cancer Father        prostate cancer  . Dementia Father   . Leukemia Sister        acute/ smoker  . Thyroid disease Sister   . Depression Sister        serious MVA, chronic pain  . Cancer Maternal  Grandmother        lung/ smoker  . Diabetes Maternal Grandmother        type 2  . Stroke Maternal Grandfather        several  . Hypertension Maternal Grandfather   . Cancer Paternal Grandfather        bone/ smoker  . Cancer Daughter        2 abnormal skin lesion excisions at age 38 first time  . Asthma Son   . Colon cancer Neg Hx   . Rectal cancer Neg Hx   . Stomach cancer Neg Hx     Social History   Socioeconomic History  . Marital status: Married    Spouse name: Not on file  . Number of children: Not on file  . Years of education: Not on file  . Highest education level: Not on file  Occupational History  . Not on file  Tobacco Use  . Smoking status: Never Smoker  . Smokeless tobacco: Never Used  Vaping Use  . Vaping Use: Never used  Substance and Sexual Activity  . Alcohol use: Yes    Alcohol/week: 2.0 standard drinks    Types: 2 Glasses of wine per week    Comment: occ  . Drug use: No  . Sexual activity: Yes  Other  Topics Concern  . Not on file  Social History Narrative  . Not on file   Social Determinants of Health   Financial Resource Strain:   . Difficulty of Paying Living Expenses: Not on file  Food Insecurity:   . Worried About Charity fundraiser in the Last Year: Not on file  . Ran Out of Food in the Last Year: Not on file  Transportation Needs:   . Lack of Transportation (Medical): Not on file  . Lack of Transportation (Non-Medical): Not on file  Physical Activity:   . Days of Exercise per Week: Not on file  . Minutes of Exercise per Session: Not on file  Stress:   . Feeling of Stress : Not on file  Social Connections:   . Frequency of Communication with Friends and Family: Not on file  . Frequency of Social Gatherings with Friends and Family: Not on file  . Attends Religious Services: Not on file  . Active Member of Clubs or Organizations: Not on file  . Attends Archivist Meetings: Not on file  . Marital Status: Not on file    Intimate Partner Violence:   . Fear of Current or Ex-Partner: Not on file  . Emotionally Abused: Not on file  . Physically Abused: Not on file  . Sexually Abused: Not on file    Outpatient Medications Prior to Visit  Medication Sig Dispense Refill  . acetaminophen (TYLENOL) 500 MG tablet Take 500 mg by mouth every 6 (six) hours as needed for pain.    Marland Kitchen atovaquone-proguanil (MALARONE) 250-100 MG TABS tablet Take 1 tablet by mouth daily. 40 tablet 0  . cetirizine (ZYRTEC) 10 MG tablet Take 10 mg by mouth daily.    Marland Kitchen ibuprofen (ADVIL) 200 MG tablet Take 200 mg by mouth every 6 (six) hours as needed for pain.    Marland Kitchen levothyroxine (SYNTHROID) 100 MCG tablet TAKE 1 TABLET(100 MCG) BY MOUTH DAILY 90 tablet 1  . meloxicam (MOBIC) 15 MG tablet TAKE 1 TABLET(15 MG) BY MOUTH DAILY 30 tablet 0  . scopolamine (TRANSDERM-SCOP, 1.5 MG,) 1 MG/3DAYS Place 1 patch (1.5 mg total) onto the skin as needed. 10 patch 1  . typhoid (VIVOTIF) DR capsule Take 1 capsule by mouth every other day. 4 capsule 0  . valACYclovir (VALTREX) 500 MG tablet Take 1 tablet (500 mg total) by mouth daily. 30 tablet 5   No facility-administered medications prior to visit.    Allergies  Allergen Reactions  . Other     Advocado - pt stated, "Made me nausea and vomitting"    Review of Systems  Constitutional: Negative for chills, fever and malaise/fatigue.  HENT: Negative for congestion and hearing loss.   Eyes: Negative for discharge.  Respiratory: Negative for cough, sputum production and shortness of breath.   Cardiovascular: Negative for chest pain, palpitations and leg swelling.  Gastrointestinal: Negative for abdominal pain, blood in stool, constipation, diarrhea, heartburn, nausea and vomiting.  Genitourinary: Negative for dysuria, frequency, hematuria and urgency.  Musculoskeletal: Negative for back pain, falls and myalgias.  Skin: Negative for rash.  Neurological: Negative for dizziness, sensory change, loss of  consciousness, weakness and headaches.  Endo/Heme/Allergies: Negative for environmental allergies. Does not bruise/bleed easily.  Psychiatric/Behavioral: Positive for depression. Negative for suicidal ideas. The patient is not nervous/anxious and does not have insomnia.        Objective:    Physical Exam Constitutional:      General: She is not in acute distress.  Appearance: She is not diaphoretic.  HENT:     Head: Normocephalic and atraumatic.     Right Ear: External ear normal.     Left Ear: External ear normal.     Nose: Nose normal.     Mouth/Throat:     Pharynx: No oropharyngeal exudate.  Eyes:     General: No scleral icterus.       Right eye: No discharge.        Left eye: No discharge.     Conjunctiva/sclera: Conjunctivae normal.     Pupils: Pupils are equal, round, and reactive to light.  Neck:     Thyroid: No thyromegaly.  Cardiovascular:     Rate and Rhythm: Normal rate and regular rhythm.     Heart sounds: Normal heart sounds. No murmur heard.   Pulmonary:     Effort: Pulmonary effort is normal. No respiratory distress.     Breath sounds: Normal breath sounds. No wheezing or rales.  Abdominal:     General: Bowel sounds are normal. There is no distension.     Palpations: Abdomen is soft. There is no mass.     Tenderness: There is no abdominal tenderness.  Musculoskeletal:        General: No tenderness. Normal range of motion.     Cervical back: Normal range of motion and neck supple.  Lymphadenopathy:     Cervical: No cervical adenopathy.  Skin:    General: Skin is warm and dry.     Findings: No rash.  Neurological:     Mental Status: She is alert and oriented to person, place, and time.     Cranial Nerves: No cranial nerve deficit.     Coordination: Coordination normal.     Deep Tendon Reflexes: Reflexes are normal and symmetric. Reflexes normal.     BP 118/74 (BP Location: Left Arm, Patient Position: Sitting, Cuff Size: Large)   Pulse 64   Temp  98.1 F (36.7 C) (Oral)   Resp 13   Ht 5\' 4"  (1.626 m)   Wt 147 lb 12.8 oz (67 kg)   LMP 07/05/2010   SpO2 95%   BMI 25.37 kg/m  Wt Readings from Last 3 Encounters:  09/21/19 147 lb 12.8 oz (67 kg)  06/15/19 147 lb (66.7 kg)  05/18/19 148 lb (67.1 kg)    Diabetic Foot Exam - Simple   No data filed     Lab Results  Component Value Date   WBC 4.4 09/21/2019   HGB 14.0 09/21/2019   HCT 41.2 09/21/2019   PLT 220 09/21/2019   GLUCOSE 90 09/21/2019   CHOL 204 (H) 09/21/2019   TRIG 38 09/21/2019   HDL 73 09/21/2019   LDLCALC 119 (H) 09/21/2019   ALT 13 09/21/2019   AST 17 09/21/2019   NA 142 09/21/2019   K 4.5 09/21/2019   CL 105 09/21/2019   CREATININE 0.74 09/21/2019   BUN 13 09/21/2019   CO2 30 09/21/2019   TSH 0.06 (L) 03/10/2019    Lab Results  Component Value Date   TSH 0.06 (L) 03/10/2019   Lab Results  Component Value Date   WBC 4.4 09/21/2019   HGB 14.0 09/21/2019   HCT 41.2 09/21/2019   MCV 94.3 09/21/2019   PLT 220 09/21/2019   Lab Results  Component Value Date   NA 142 09/21/2019   K 4.5 09/21/2019   CO2 30 09/21/2019   GLUCOSE 90 09/21/2019   BUN 13 09/21/2019   CREATININE 0.74  09/21/2019   BILITOT 0.8 09/21/2019   ALKPHOS 81 03/10/2019   AST 17 09/21/2019   ALT 13 09/21/2019   PROT 6.6 09/21/2019   ALBUMIN 4.1 03/10/2019   CALCIUM 9.6 09/21/2019   GFR 84.65 03/10/2019   Lab Results  Component Value Date   CHOL 204 (H) 09/21/2019   Lab Results  Component Value Date   HDL 73 09/21/2019   Lab Results  Component Value Date   LDLCALC 119 (H) 09/21/2019   Lab Results  Component Value Date   TRIG 38 09/21/2019   Lab Results  Component Value Date   CHOLHDL 2.8 09/21/2019   No results found for: HGBA1C     Assessment & Plan:   Problem List Items Addressed This Visit    Hypothyroidism    On Levothyroxine, continue to monitor      Relevant Orders   TSH   Preventative health care    Patient encouraged to maintain heart  healthy diet, regular exercise, adequate sleep. Consider daily probiotics. Take medications as prescribed. Labs reviewed and ordered. Colonoscopy due in 2024, mgm was April 2021 repeat in August 2022. She agrees to go to pharmacy today for Shingrix and flu shot      Sun-damaged skin    Continue with dermatology annually      Anemia - Primary   Relevant Orders   CBC (Completed)   Hyperlipidemia, mixed    Encouraged heart healthy diet, increase exercise, avoid trans fats, consider a krill oil cap daily      Relevant Orders   Comprehensive metabolic panel (Completed)   Lipid panel (Completed)   Right knee pain    Had surgery on right knee back in July due to a medical meniscal tear. By Dr Maureen Ralphs          I am having Keshonna Kauffmann maintain her ibuprofen, cetirizine, acetaminophen, scopolamine, typhoid, atovaquone-proguanil, valACYclovir, meloxicam, and levothyroxine.  No orders of the defined types were placed in this encounter.    Penni Homans, MD

## 2019-09-21 NOTE — Assessment & Plan Note (Signed)
Encouraged heart healthy diet, increase exercise, avoid trans fats, consider a krill oil cap daily 

## 2019-09-22 LAB — LIPID PANEL
Cholesterol: 204 mg/dL — ABNORMAL HIGH (ref <200)
HDL: 73 mg/dL (ref >=50)
LDL Cholesterol (Calc): 119 mg/dL (calc) — ABNORMAL HIGH
Non-HDL Cholesterol (Calc): 131 mg/dL (calc) — ABNORMAL HIGH (ref <130)
Total CHOL/HDL Ratio: 2.8 (calc) (ref <5.0)
Triglycerides: 38 mg/dL (ref <150)

## 2019-09-22 LAB — CBC
HCT: 41.2 % (ref 35.0–45.0)
Hemoglobin: 14 g/dL (ref 11.7–15.5)
MCH: 32 pg (ref 27.0–33.0)
MCHC: 34 g/dL (ref 32.0–36.0)
MCV: 94.3 fL (ref 80.0–100.0)
MPV: 10.7 fL (ref 7.5–12.5)
Platelets: 220 10*3/uL (ref 140–400)
RBC: 4.37 10*6/uL (ref 3.80–5.10)
RDW: 12.5 % (ref 11.0–15.0)
WBC: 4.4 10*3/uL (ref 3.8–10.8)

## 2019-09-22 LAB — COMPREHENSIVE METABOLIC PANEL
AG Ratio: 1.9 (calc) (ref 1.0–2.5)
ALT: 13 U/L (ref 6–29)
AST: 17 U/L (ref 10–35)
Albumin: 4.3 g/dL (ref 3.6–5.1)
Alkaline phosphatase (APISO): 83 U/L (ref 37–153)
BUN: 13 mg/dL (ref 7–25)
CO2: 30 mmol/L (ref 20–32)
Calcium: 9.6 mg/dL (ref 8.6–10.4)
Chloride: 105 mmol/L (ref 98–110)
Creat: 0.74 mg/dL (ref 0.50–1.05)
Globulin: 2.3 g/dL (calc) (ref 1.9–3.7)
Glucose, Bld: 90 mg/dL (ref 65–99)
Potassium: 4.5 mmol/L (ref 3.5–5.3)
Sodium: 142 mmol/L (ref 135–146)
Total Bilirubin: 0.8 mg/dL (ref 0.2–1.2)
Total Protein: 6.6 g/dL (ref 6.1–8.1)

## 2019-09-22 LAB — TSH: TSH: 0.82 mIU/L (ref 0.40–4.50)

## 2020-03-19 ENCOUNTER — Other Ambulatory Visit: Payer: Self-pay | Admitting: Family Medicine

## 2020-04-04 ENCOUNTER — Encounter: Payer: 59 | Admitting: Family Medicine

## 2020-04-12 DIAGNOSIS — H52223 Regular astigmatism, bilateral: Secondary | ICD-10-CM | POA: Diagnosis not present

## 2020-04-22 ENCOUNTER — Other Ambulatory Visit: Payer: Self-pay

## 2020-04-22 ENCOUNTER — Encounter: Payer: Self-pay | Admitting: Family Medicine

## 2020-04-22 ENCOUNTER — Ambulatory Visit (INDEPENDENT_AMBULATORY_CARE_PROVIDER_SITE_OTHER): Payer: 59 | Admitting: Family Medicine

## 2020-04-22 VITALS — BP 106/70 | HR 73 | Temp 98.4°F | Resp 16 | Ht 64.0 in | Wt 149.4 lb

## 2020-04-22 DIAGNOSIS — E039 Hypothyroidism, unspecified: Secondary | ICD-10-CM | POA: Diagnosis not present

## 2020-04-22 DIAGNOSIS — E782 Mixed hyperlipidemia: Secondary | ICD-10-CM

## 2020-04-22 DIAGNOSIS — Z0000 Encounter for general adult medical examination without abnormal findings: Secondary | ICD-10-CM | POA: Diagnosis not present

## 2020-04-22 DIAGNOSIS — D649 Anemia, unspecified: Secondary | ICD-10-CM

## 2020-04-22 LAB — CBC
HCT: 42.7 % (ref 36.0–46.0)
Hemoglobin: 14.4 g/dL (ref 12.0–15.0)
MCHC: 33.7 g/dL (ref 30.0–36.0)
MCV: 95.6 fl (ref 78.0–100.0)
Platelets: 230 10*3/uL (ref 150.0–400.0)
RBC: 4.46 Mil/uL (ref 3.87–5.11)
RDW: 13.5 % (ref 11.5–15.5)
WBC: 3.8 10*3/uL — ABNORMAL LOW (ref 4.0–10.5)

## 2020-04-22 LAB — COMPREHENSIVE METABOLIC PANEL
ALT: 13 U/L (ref 0–35)
AST: 16 U/L (ref 0–37)
Albumin: 4.2 g/dL (ref 3.5–5.2)
Alkaline Phosphatase: 83 U/L (ref 39–117)
BUN: 13 mg/dL (ref 6–23)
CO2: 32 mEq/L (ref 19–32)
Calcium: 9.6 mg/dL (ref 8.4–10.5)
Chloride: 105 mEq/L (ref 96–112)
Creatinine, Ser: 0.67 mg/dL (ref 0.40–1.20)
GFR: 96.1 mL/min (ref >60.00)
Glucose, Bld: 87 mg/dL (ref 70–99)
Potassium: 4.9 mEq/L (ref 3.5–5.1)
Sodium: 142 mEq/L (ref 135–145)
Total Bilirubin: 0.7 mg/dL (ref 0.2–1.2)
Total Protein: 6.5 g/dL (ref 6.0–8.3)

## 2020-04-22 LAB — TSH: TSH: 0.82 u[IU]/mL (ref 0.35–4.50)

## 2020-04-22 NOTE — Assessment & Plan Note (Signed)
On Levothyroxine, continue to monitor 

## 2020-04-22 NOTE — Patient Instructions (Signed)
Preventive Care 59-59 Years Old, Female Preventive care refers to lifestyle choices and visits with your health care provider that can promote health and wellness. This includes:  A yearly physical exam. This is also called an annual wellness visit.  Regular dental and eye exams.  Immunizations.  Screening for certain conditions.  Healthy lifestyle choices, such as: ? Eating a healthy diet. ? Getting regular exercise. ? Not using drugs or products that contain nicotine and tobacco. ? Limiting alcohol use. What can I expect for my preventive care visit? Physical exam Your health care provider will check your:  Height and weight. These may be used to calculate your BMI (body mass index). BMI is a measurement that tells if you are at a healthy weight.  Heart rate and blood pressure.  Body temperature.  Skin for abnormal spots. Counseling Your health care provider may ask you questions about your:  Past medical problems.  Family's medical history.  Alcohol, tobacco, and drug use.  Emotional well-being.  Home life and relationship well-being.  Sexual activity.  Diet, exercise, and sleep habits.  Work and work Statistician.  Access to firearms.  Method of birth control.  Menstrual cycle.  Pregnancy history. What immunizations do I need? Vaccines are usually given at various ages, according to a schedule. Your health care provider will recommend vaccines for you based on your age, medical history, and lifestyle or other factors, such as travel or where you work.   What tests do I need? Blood tests  Lipid and cholesterol levels. These may be checked every 5 years, or more often if you are over 59 years old.  Hepatitis C test.  Hepatitis B test. Screening  Lung cancer screening. You may have this screening every year starting at age 59 if you have a 30-pack-year history of smoking and currently smoke or have quit within the past 15 years.  Colorectal cancer  screening. ? All adults should have this screening starting at age 59 and continuing until age 17. ? Your health care provider may recommend screening at age 59 if you are at increased risk. ? You will have tests every 1-10 years, depending on your results and the type of screening test.  Diabetes screening. ? This is done by checking your blood sugar (glucose) after you have not eaten for a while (fasting). ? You may have this done every 1-3 years.  Mammogram. ? This may be done every 1-2 years. ? Talk with your health care provider about when you should start having regular mammograms. This may depend on whether you have a family history of breast cancer.  BRCA-related cancer screening. This may be done if you have a family history of breast, ovarian, tubal, or peritoneal cancers.  Pelvic exam and Pap test. ? This may be done every 3 years starting at age 10. ? Starting at age 59, this may be done every 5 years if you have a Pap test in combination with an HPV test. Other tests  STD (sexually transmitted disease) testing, if you are at risk.  Bone density scan. This is done to screen for osteoporosis. You may have this scan if you are at high risk for osteoporosis. Talk with your health care provider about your test results, treatment options, and if necessary, the need for more tests. Follow these instructions at home: Eating and drinking  Eat a diet that includes fresh fruits and vegetables, whole grains, lean protein, and low-fat dairy products.  Take vitamin and mineral supplements  as recommended by your health care provider.  Do not drink alcohol if: ? Your health care provider tells you not to drink. ? You are pregnant, may be pregnant, or are planning to become pregnant.  If you drink alcohol: ? Limit how much you have to 0-1 drink a day. ? Be aware of how much alcohol is in your drink. In the [place] one drink equals one 12 oz bottle of beer (355 mL), one 5 oz glass of  wine (148 mL), or one 1 oz glass of hard liquor (44 mL).   Lifestyle  Take daily care of your teeth and gums. Brush your teeth every morning and night with fluoride toothpaste. Floss one time each day.  Stay active. Exercise for at least 30 minutes 5 or more days each week.  Do not use any products that contain nicotine or tobacco, such as cigarettes, e-cigarettes, and chewing tobacco. If you need help quitting, ask your health care provider.  Do not use drugs.  If you are sexually active, practice safe sex. Use a condom or other form of protection to prevent STIs (sexually transmitted infections).  If you do not wish to become pregnant, use a form of birth control. If you plan to become pregnant, see your health care provider for a prepregnancy visit.  If told by your health care provider, take low-dose aspirin daily starting at age 59.  Find healthy ways to cope with stress, such as: ? Meditation, yoga, or listening to music. ? Journaling. ? Talking to a trusted person. ? Spending time with friends and family. Safety  Always wear your seat belt while driving or riding in a vehicle.  Do not drive: ? If you have been drinking alcohol. Do not ride with someone who has been drinking. ? When you are tired or distracted. ? While texting.  Wear a helmet and other protective equipment during sports activities.  If you have firearms in your house, make sure you follow all gun safety procedures. What's next?  Visit your health care provider once a year for an annual wellness visit.  Ask your health care provider how often you should have your eyes and teeth checked.  Stay up to date on all vaccines. This information is not intended to replace advice given to you by your health care provider. Make sure you discuss any questions you have with your health care provider. Document Revised: 09/22/2019 Document Reviewed: 08/29/2017 Elsevier Patient Education  2021 [employer]  

## 2020-04-24 NOTE — Assessment & Plan Note (Signed)
Patient encouraged to maintain heart healthy diet, regular exercise, adequate sleep. Consider daily probiotics. Take medications as prescribed. Labs ordered and reviewed. Colonoscopy last 2014, repeat in 2024. MGM April 2021 repeat in next 6 months.

## 2020-04-24 NOTE — Assessment & Plan Note (Signed)
She has started the NOOM diet and will attempt increased exercise as her knee improves. Recheck with next visit.

## 2020-04-24 NOTE — Progress Notes (Signed)
Subjective:    Patient ID: Jordan Juarez, female    DOB: 1960/05/15, 60 y.o.   MRN: [id number]  Chief Complaint  Patient presents with  . Annual Exam    Pt has no problems or concerns    HPI Patient is in today for annual preventative exam and follow up on chronic medical concerns. No recent febrile illness or hospitalizations. She is struggling with knee pain and a medial meniscal tear. She has not been able to be as active as she would like and is frustrated with weight gain. Tries to maintain a heart healthy diet. Continues to help babysit her grandbaby and enjoys that. Denies CP/palp/SOB/HA/congestion/fevers/GI or GU c/o. Taking meds as prescribed  Past Medical History:  Diagnosis Date  . Allergic state 06/03/2012  . Anemia 06/03/2012  . Breast lesion on mammography 06/03/2012   S/p biopsy on left with marker left behind, benign  . Chicken pox as a child  . Hyperlipidemia, mixed 08/11/2015  . Hypothyroidism   . Overweight 08/08/2014  . PONV (postoperative nausea and vomiting)   . Preventative health care 06/03/2012  . Sun-damaged skin 06/03/2012  . Thyroid nodule 06/03/2012  . Unspecified hypothyroidism 06/03/2012    Past Surgical History:  Procedure Laterality Date  . ABDOMINAL HYSTERECTOMY  07-13-10   still has ovaries  . BREAST BIOPSY  2011  . BREAST SURGERY     left breast biopsies  . ENDOMETRIAL ABLATION  2009   done in office- n&v postop  . KNEE SURGERY    . LAPAROSCOPIC ASSISTED VAGINAL HYSTERECTOMY  07/13/2010   Procedure: LAPAROSCOPIC ASSISTED VAGINAL HYSTERECTOMY;  Surgeon: Margarette Asal;  Location: [facility];  Service: Gynecology;  Laterality: N/A;  . MOLE REMOVAL Right    forehead, 4th grade    Family History  Problem Relation Age of Onset  . Heart attack Mother   . Emphysema Mother        smoker  . Thyroid disease Mother   . Hypertension Father   . Cancer Father        prostate cancer  . Dementia Father   . Leukemia Sister        acute/ smoker  . Thyroid  disease Sister   . Depression Sister        serious MVA, chronic pain  . Cancer Maternal Grandmother        lung/ smoker  . Diabetes Maternal Grandmother        type 2  . Stroke Maternal Grandfather        several  . Hypertension Maternal Grandfather   . Cancer Paternal Grandfather        bone/ smoker  . Cancer Daughter        2 abnormal skin lesion excisions at age 22 first time  . Asthma Son   . Colon cancer Neg Hx   . Rectal cancer Neg Hx   . Stomach cancer Neg Hx     Social History   Socioeconomic History  . Marital status: Married    Spouse name: Not on file  . Number of children: Not on file  . Years of education: Not on file  . Highest education level: Not on file  Occupational History  . Not on file  Tobacco Use  . Smoking status: Never Smoker  . Smokeless tobacco: Never Used  Vaping Use  . Vaping Use: Never used  Substance and Sexual Activity  . Alcohol use: Yes    Alcohol/week: 2.0 standard drinks  Types: 2 Glasses of wine per week    Comment: occ  . Drug use: No  . Sexual activity: Yes  Other Topics Concern  . Not on file  Social History Narrative  . Not on file   Social Determinants of Health   Financial Resource Strain: Not on file  Food Insecurity: Not on file  Transportation Needs: Not on file  Physical Activity: Not on file  Stress: Not on file  Social Connections: Not on file  Intimate Partner Violence: Not on file    Outpatient Medications Prior to Visit  Medication Sig Dispense Refill  . acetaminophen (TYLENOL) 500 MG tablet Take 500 mg by mouth every 6 (six) hours as needed for pain.    Marland Kitchen atovaquone-proguanil (MALARONE) 250-100 MG TABS tablet Take 1 tablet by mouth daily. 40 tablet 0  . cetirizine (ZYRTEC) 10 MG tablet Take 10 mg by mouth daily.    Marland Kitchen ibuprofen (ADVIL) 200 MG tablet Take 200 mg by mouth every 6 (six) hours as needed for pain.    Marland Kitchen levothyroxine (SYNTHROID) 100 MCG tablet Take 1 tablet (100 mcg total) by mouth daily  before breakfast. 90 tablet 0  . meloxicam (MOBIC) 15 MG tablet TAKE 1 TABLET(15 MG) BY MOUTH DAILY 30 tablet 0  . scopolamine (TRANSDERM-SCOP, 1.5 MG,) 1 MG/3DAYS Place 1 patch (1.5 mg total) onto the skin as needed. 10 patch 1  . typhoid (VIVOTIF) DR capsule Take 1 capsule by mouth every other day. 4 capsule 0  . valACYclovir (VALTREX) 500 MG tablet Take 1 tablet (500 mg total) by mouth daily. 30 tablet 5   No facility-administered medications prior to visit.    Allergies  Allergen Reactions  . Other     Advocado - pt stated, "Made me nausea and vomitting"    Review of Systems  Constitutional: Negative for chills, fever and malaise/fatigue.  HENT: Negative for congestion and hearing loss.   Eyes: Negative for discharge.  Respiratory: Negative for cough, sputum production and shortness of breath.   Cardiovascular: Negative for chest pain, palpitations and leg swelling.  Gastrointestinal: Negative for abdominal pain, blood in stool, constipation, diarrhea, heartburn, nausea and vomiting.  Genitourinary: Negative for dysuria, frequency, hematuria and urgency.  Musculoskeletal: Positive for joint pain. Negative for back pain, falls and myalgias.  Skin: Negative for rash.  Neurological: Negative for dizziness, sensory change, loss of consciousness, weakness and headaches.  Endo/Heme/Allergies: Negative for environmental allergies. Does not bruise/bleed easily.  Psychiatric/Behavioral: Negative for depression and suicidal ideas. The patient is not nervous/anxious and does not have insomnia.        Objective:    Physical Exam Constitutional:      General: She is not in acute distress.    Appearance: She is not diaphoretic.  HENT:     Head: Normocephalic and atraumatic.     Right Ear: External ear normal.     Left Ear: External ear normal.     Nose: Nose normal.     Mouth/Throat:     Pharynx: No oropharyngeal exudate.  Eyes:     General: No scleral icterus.       Right eye: No  discharge.        Left eye: No discharge.     Conjunctiva/sclera: Conjunctivae normal.     Pupils: Pupils are equal, round, and reactive to light.  Neck:     Thyroid: No thyromegaly.  Cardiovascular:     Rate and Rhythm: Normal rate and regular rhythm.  Heart sounds: Normal heart sounds. No murmur heard.   Pulmonary:     Effort: Pulmonary effort is normal. No respiratory distress.     Breath sounds: Normal breath sounds. No wheezing or rales.  Abdominal:     General: Bowel sounds are normal. There is no distension.     Palpations: Abdomen is soft. There is no mass.     Tenderness: There is no abdominal tenderness.  Musculoskeletal:        General: No tenderness. Normal range of motion.     Cervical back: Normal range of motion and neck supple.  Lymphadenopathy:     Cervical: No cervical adenopathy.  Skin:    General: Skin is warm and dry.     Findings: No rash.  Neurological:     Mental Status: She is alert and oriented to person, place, and time.     Cranial Nerves: No cranial nerve deficit.     Coordination: Coordination normal.     Deep Tendon Reflexes: Reflexes are normal and symmetric. Reflexes normal.     BP 106/70   Pulse 73   Temp 98.4 F (36.9 C)   Resp 16   Ht 5\' 4"  (1.626 m)   Wt 149 lb 6.4 oz (67.8 kg)   LMP 07/05/2010   SpO2 97%   BMI 25.64 kg/m  Wt Readings from Last 3 Encounters:  04/22/20 149 lb 6.4 oz (67.8 kg)  09/21/19 147 lb 12.8 oz (67 kg)  06/15/19 147 lb (66.7 kg)    Diabetic Foot Exam - Simple   No data filed    Lab Results  Component Value Date   WBC 3.8 (L) 04/22/2020   HGB 14.4 04/22/2020   HCT 42.7 04/22/2020   PLT 230.0 04/22/2020   GLUCOSE 87 04/22/2020   CHOL 204 (H) 09/21/2019   TRIG 38 09/21/2019   HDL 73 09/21/2019   LDLCALC 119 (H) 09/21/2019   ALT 13 04/22/2020   AST 16 04/22/2020   NA 142 04/22/2020   K 4.9 04/22/2020   CL 105 04/22/2020   CREATININE 0.67 04/22/2020   BUN 13 04/22/2020   CO2 32 04/22/2020    TSH 0.82 04/22/2020    Lab Results  Component Value Date   TSH 0.82 04/22/2020   Lab Results  Component Value Date   WBC 3.8 (L) 04/22/2020   HGB 14.4 04/22/2020   HCT 42.7 04/22/2020   MCV 95.6 04/22/2020   PLT 230.0 04/22/2020   Lab Results  Component Value Date   NA 142 04/22/2020   K 4.9 04/22/2020   CO2 32 04/22/2020   GLUCOSE 87 04/22/2020   BUN 13 04/22/2020   CREATININE 0.67 04/22/2020   BILITOT 0.7 04/22/2020   ALKPHOS 83 04/22/2020   AST 16 04/22/2020   ALT 13 04/22/2020   PROT 6.5 04/22/2020   ALBUMIN 4.2 04/22/2020   CALCIUM 9.6 04/22/2020   GFR 96.10 04/22/2020   Lab Results  Component Value Date   CHOL 204 (H) 09/21/2019   Lab Results  Component Value Date   HDL 73 09/21/2019   Lab Results  Component Value Date   LDLCALC 119 (H) 09/21/2019   Lab Results  Component Value Date   TRIG 38 09/21/2019   Lab Results  Component Value Date   CHOLHDL 2.8 09/21/2019   No results found for: HGBA1C     Assessment & Plan:   Problem List Items Addressed This Visit    Hypothyroidism - Primary    On Levothyroxine,  continue to monitor      Relevant Orders   TSH (Completed)   Preventative health care    Patient encouraged to maintain heart healthy diet, regular exercise, adequate sleep. Consider daily probiotics. Take medications as prescribed. Labs ordered and reviewed. Colonoscopy last 2014, repeat in 2024. MGM April 2021 repeat in next 6 months.       Anemia   Relevant Orders   CBC (Completed)   Hyperlipidemia, mixed    She has started the NOOM diet and will attempt increased exercise as her knee improves. Recheck with next visit.      Relevant Orders   Comprehensive metabolic panel (Completed)   TSH (Completed)      I am having Emoree Fults maintain her ibuprofen, cetirizine, acetaminophen, scopolamine, typhoid, atovaquone-proguanil, valACYclovir, meloxicam, and levothyroxine.  No orders of the defined types were placed in this  encounter.    Penni Homans, MD

## 2020-06-20 ENCOUNTER — Other Ambulatory Visit: Payer: Self-pay | Admitting: Family Medicine

## 2020-10-24 ENCOUNTER — Other Ambulatory Visit: Payer: 59

## 2020-11-30 ENCOUNTER — Other Ambulatory Visit: Payer: Self-pay

## 2020-11-30 ENCOUNTER — Telehealth (INDEPENDENT_AMBULATORY_CARE_PROVIDER_SITE_OTHER): Payer: 59 | Admitting: Medical

## 2020-11-30 VITALS — Temp 99.8°F

## 2020-11-30 DIAGNOSIS — J0100 Acute maxillary sinusitis, unspecified: Secondary | ICD-10-CM

## 2020-11-30 DIAGNOSIS — R051 Acute cough: Secondary | ICD-10-CM

## 2020-11-30 DIAGNOSIS — J40 Bronchitis, not specified as acute or chronic: Secondary | ICD-10-CM | POA: Diagnosis not present

## 2020-11-30 MED ORDER — ALBUTEROL SULFATE HFA 108 (90 BASE) MCG/ACT IN AERS: 2.0000 | INHALATION_SPRAY | Freq: Four times a day (QID) | RESPIRATORY_TRACT | 0 refills | Status: DC | PRN

## 2020-11-30 MED ORDER — BENZONATATE 100 MG PO CAPS: 100.0000 mg | ORAL_CAPSULE | Freq: Three times a day (TID) | ORAL | 0 refills | Status: DC | PRN

## 2020-11-30 MED ORDER — AZITHROMYCIN 250 MG PO TABS: ORAL_TABLET | ORAL | 0 refills | Status: AC

## 2020-11-30 MED ORDER — FLUTICASONE PROPIONATE 50 MCG/ACT NA SUSP: 2.0000 | Freq: Every day | NASAL | 1 refills | Status: DC

## 2020-11-30 NOTE — Patient Instructions (Addendum)
Virtual visit done by telephone since video link did not go through.  Signs and symptoms presently of bronchitis and sinus infection like.  COVID test negative x2.  3 days since  symptoms so in event of flu past Tamiflu treatment timeframe.  Discussed patient's questions regarding RSV.  Explained given her age.  I think unlikely but offered PCR RSV test if she desired.  Prescribed azithromycin antibiotic, Flonase for nasal congestion and benzonatate for cough.  Patient will continue to use her albuterol every 4-6 hours as needed.  Asked that if she is having to use albuterol frequently please let me know.  Also asked to get O2 sat monitor and check daily.  Let me know if any O2 sats less than 94%.  If wheezing persists, low O2 sats or increasing chest congestion then recommend chest x-ray at the med center outpatient.  That order was placed today.  Follow-up in 7 days or sooner if needed.

## 2020-11-30 NOTE — Progress Notes (Signed)
Subjective:    Patient ID: Jordan Juarez, female    DOB: 1960/04/28, 60 y.o.   MRN: [id number]  HPI  Virtual Visit via Telephone Note  I connected with Jordan Juarez on 11/30/20 at 10:20 AM EST by telephone and verified that I am speaking with the correct person using two identifiers.  Location: Patient: home Provider: office   I discussed the limitations, risks, security and privacy concerns of performing an evaluation and management service by telephone and the availability of in person appointments. I also discussed with the patient that there may be a patient responsible charge related to this service. The patient expressed understanding and agreed to proceed.   History of Present Illness: Pt states around September pt had cough. Mild lingering cough since sept. But recent cough is worse.  Pt has some nasal congestion, chest congestion and coughing up some mucus since Saturday  She is having some wheezing. Pt has no hx of asthma. Pt used her son inhaler. It seemed to help. Pt does describe in past if ever allergies will wheeze. Pt does not have 02 sat monitor. Albuterol did not clear wheezing quickly.  Pt blowing out mucus from nose and coughing up mucus.     Recently tested twice negative for covid. Yesterday and today covid test negative.   Observations/Objective:  General- no acute distress, pleasant and alert. Coughing intermittent during the interview. Does not sound labored.  Assessment and Plan:  Patient Instructions   On virtual visit done by telephone since video link did not go through.  Signs and symptoms presently of bronchitis and sinus infection like.  COVID test negative x2.  3 days since  symptoms so in event of flu past Tamiflu treatment timeframe.  Discussed patient's questions regarding RSV.  Explained given her age.  I think unlikely but offered PCR RSV test if she desired.  Prescribed azithromycin antibiotic, Flonase for nasal congestion and  benzonatate for cough.  Patient will continue to use her albuterol every 4-6 hours as needed.  Asked that if she is having to use albuterol frequently please let me know.  Also asked to get O2 sat monitor and check daily.  Let me know if any O2 sats less than 94%.  If wheezing persists, low O2 sats or increasing chest congestion then recommend chest x-ray at the med center outpatient.  That order was placed today.  Follow-up in 7 days or sooner if needed.   Mackie Pai, PA-C   Time spent with patient today was 22  minutes which consisted of chart review, discussing diagnosis, work up, treatment and documentation.  Follow Up Instructions:    I discussed the assessment and treatment plan with the patient. The patient was provided an opportunity to ask questions and all were answered. The patient agreed with the plan and demonstrated an understanding of the instructions.   The patient was advised to call back or seek an in-person evaluation if the symptoms worsen or if the condition fails to improve as anticipated.     Mackie Pai, PA-C     Review of Systems  Constitutional:  Positive for chills. Negative for fatigue and fever.       Subjective fever and above her normal baseline by about 2 degrees.  Respiratory:  Positive for cough and wheezing. Negative for shortness of breath.        Cough has kept her up at night.  Gastrointestinal:  Negative for abdominal pain.  Genitourinary:  Negative for difficulty urinating and  dysuria.  Musculoskeletal:  Negative for back pain and myalgias.       Achiness. She states does not feel like the flu.(Past 2 day monitor)   Hematological:  Negative for adenopathy. Does not bruise/bleed easily.    Past Medical History:  Diagnosis Date   Allergic state 06/03/2012   Anemia 06/03/2012   Breast lesion on mammography 06/03/2012   S/p biopsy on left with marker left behind, benign   Chicken pox as a child   Hyperlipidemia, mixed 08/11/2015    Hypothyroidism    Overweight 08/08/2014   PONV (postoperative nausea and vomiting)    Preventative health care 06/03/2012   Sun-damaged skin 06/03/2012   Thyroid nodule 06/03/2012   Unspecified hypothyroidism 06/03/2012     Social History   Socioeconomic History   Marital status: Married    Spouse name: Not on file   Number of children: Not on file   Years of education: Not on file   Highest education level: Not on file  Occupational History   Not on file  Tobacco Use   Smoking status: Never   Smokeless tobacco: Never  Vaping Use   Vaping Use: Never used  Substance and Sexual Activity   Alcohol use: Yes    Alcohol/week: 2.0 standard drinks    Types: 2 Glasses of wine per week    Comment: occ   Drug use: No   Sexual activity: Yes  Other Topics Concern   Not on file  Social History Narrative   Not on file   Social Determinants of Health   Financial Resource Strain: Not on file  Food Insecurity: Not on file  Transportation Needs: Not on file  Physical Activity: Not on file  Stress: Not on file  Social Connections: Not on file  Intimate Partner Violence: Not on file    Past Surgical History:  Procedure Laterality Date   ABDOMINAL HYSTERECTOMY  07-13-10   still has ovaries   BREAST BIOPSY  2011   BREAST SURGERY     left breast biopsies   ENDOMETRIAL ABLATION  2009   done in office- n&v postop   [place]  07/13/2010   Procedure: [place];  Surgeon: Margarette Asal;  Location: [facility];  Service: Gynecology;  Laterality: N/A;   MOLE REMOVAL Right    forehead, 4th grade    Family History  Problem Relation Age of Onset   Heart attack Mother    Emphysema Mother        smoker   Thyroid disease Mother    Hypertension Father    Cancer Father        prostate cancer   Dementia Father    Leukemia Sister        acute/ smoker   Thyroid disease Sister    Depression Sister        serious  MVA, chronic pain   Cancer Maternal Grandmother        lung/ smoker   Diabetes Maternal Grandmother        type 2   Stroke Maternal Grandfather        several   Hypertension Maternal Grandfather    Cancer Paternal Grandfather        bone/ smoker   Cancer Daughter        2 abnormal skin lesion excisions at age 56 first time   Asthma Son    Colon cancer Neg Hx    Rectal  cancer Neg Hx    Stomach cancer Neg Hx     Allergies  Allergen Reactions   Other     Advocado - pt stated, "Made me nausea and vomitting"    Current Outpatient Medications on File Prior to Visit  Medication Sig Dispense Refill   acetaminophen (TYLENOL) 500 MG tablet Take 500 mg by mouth every 6 (six) hours as needed for pain.     atovaquone-proguanil (MALARONE) 250-100 MG TABS tablet Take 1 tablet by mouth daily. 40 tablet 0   cetirizine (ZYRTEC) 10 MG tablet Take 10 mg by mouth daily.     ibuprofen (ADVIL) 200 MG tablet Take 200 mg by mouth every 6 (six) hours as needed for pain.     levothyroxine (SYNTHROID) 100 MCG tablet TAKE 1 TABLET(100 MCG) BY MOUTH DAILY BEFORE BREAKFAST 90 tablet 1   meloxicam (MOBIC) 15 MG tablet TAKE 1 TABLET(15 MG) BY MOUTH DAILY 30 tablet 0   scopolamine (TRANSDERM-SCOP, 1.5 MG,) 1 MG/3DAYS Place 1 patch (1.5 mg total) onto the skin as needed. 10 patch 1   typhoid (VIVOTIF) DR capsule Take 1 capsule by mouth every other day. 4 capsule 0   valACYclovir (VALTREX) 500 MG tablet Take 1 tablet (500 mg total) by mouth daily. 30 tablet 5   No current facility-administered medications on file prior to visit.    Temp 99.8 F (37.7 C)   LMP 07/05/2010       Objective:   Physical Exam        Assessment & Plan:

## 2020-12-15 ENCOUNTER — Other Ambulatory Visit: Payer: Self-pay | Admitting: Family Medicine

## 2020-12-22 ENCOUNTER — Other Ambulatory Visit: Payer: Self-pay | Admitting: Medical

## 2021-06-13 DIAGNOSIS — H52223 Regular astigmatism, bilateral: Secondary | ICD-10-CM | POA: Diagnosis not present

## 2021-06-22 NOTE — Telephone Encounter (Signed)
Erroneous error

## 2021-07-03 ENCOUNTER — Other Ambulatory Visit: Payer: Self-pay

## 2021-07-03 MED ORDER — LEVOTHYROXINE SODIUM 100 MCG PO TABS: ORAL_TABLET | ORAL | 1 refills | Status: DC

## 2022-01-12 ENCOUNTER — Other Ambulatory Visit: Payer: Self-pay | Admitting: Family Medicine

## 2022-01-16 IMAGING — MR MR KNEE*L* W/O CM
4 of 7 series · 22 of 40 positions shown · non-contrast
Comparison: X-ray 05/18/2019

CLINICAL DATA: Posterior left knee pain and instability for 1 month

EXAM:
MRI OF THE LEFT KNEE WITHOUT CONTRAST
TECHNIQUE: Multiplanar, multisequence MR imaging of the knee was performed. No
intravenous contrast was administered.

[Series 3: T2 fat-sat · axial · 4.0mm · 0.50mm/px · z∈[-55,+60]mm · 6 of 24 slices shown]
[im 1/24]
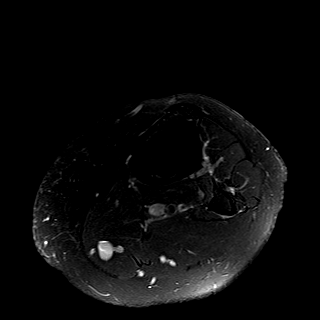
[im 5/24]
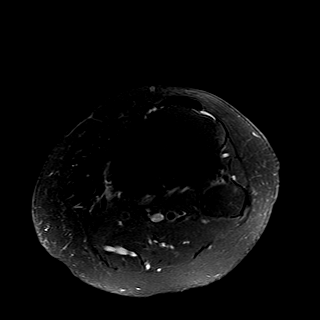
[im 10/24]
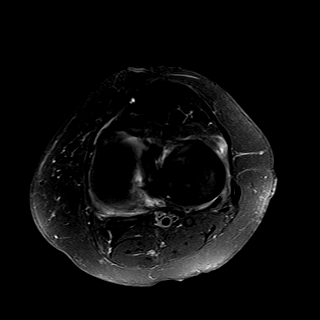
[im 14/24]
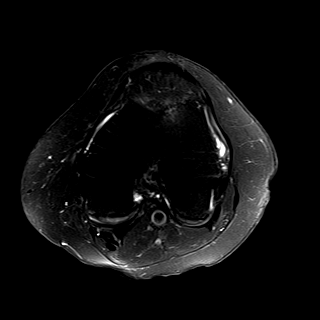
[im 19/24]
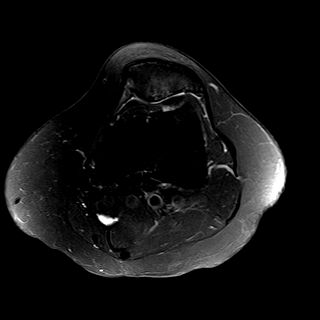
[im 24/24]
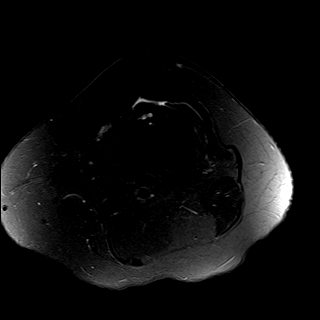

[Series 7: PD fat-sat · sagittal · 3.0mm · 0.29mm/px · 6 of 28 slices shown (1 of 3)]
[im 1/28]
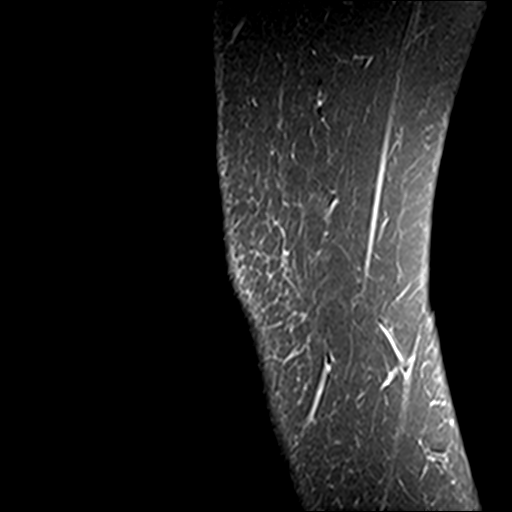
[im 6/28]
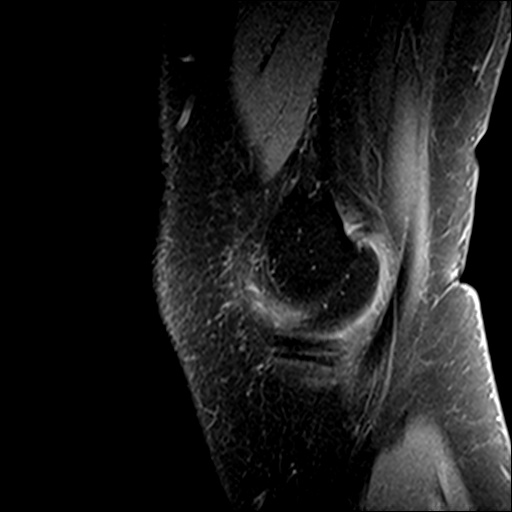
[im 11/28]
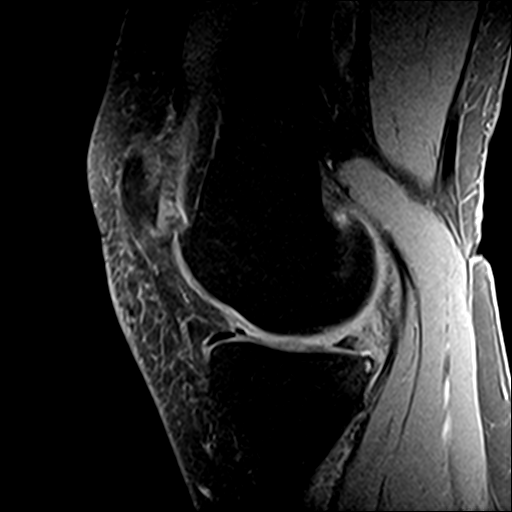
[im 17/28]
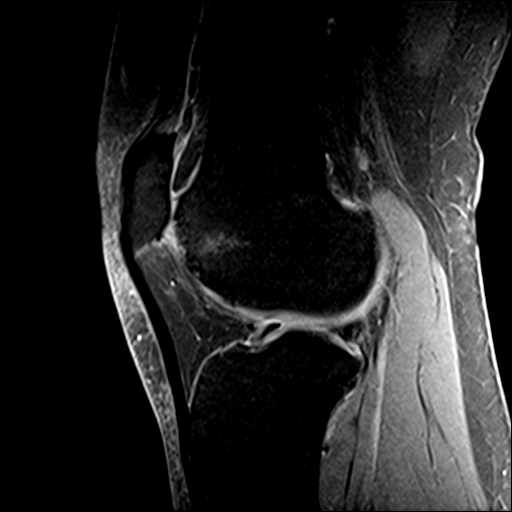
[im 22/28]
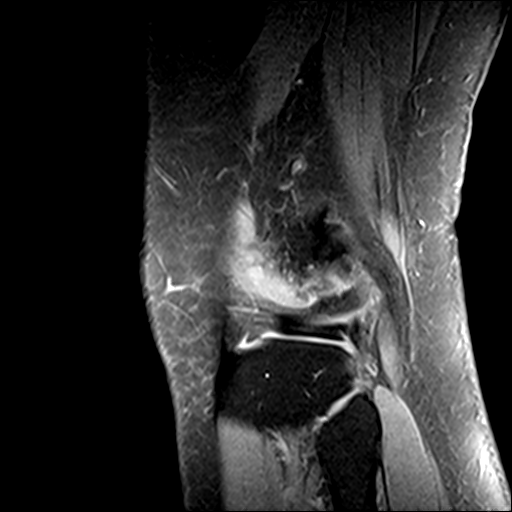
[im 28/28]
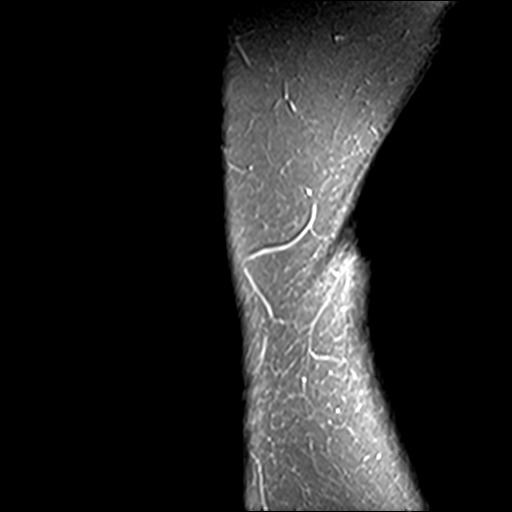

[Series 8: PD fat-sat · coronal · 3.0mm · 0.29mm/px · 7 of 32 slices shown (2 of 3)]
[im 1/32]
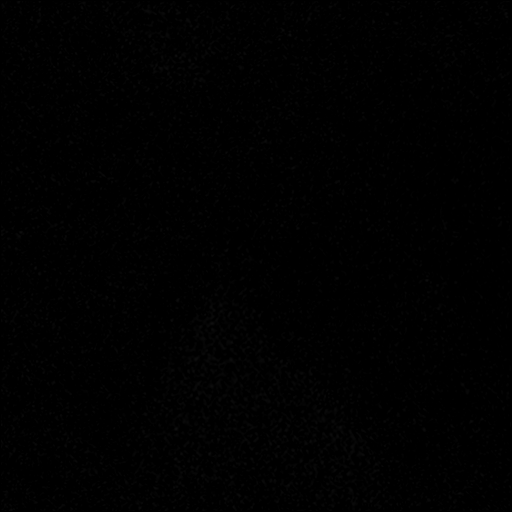
[im 6/32]
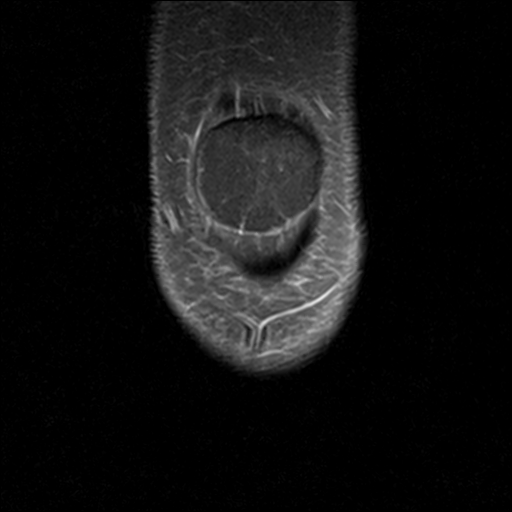
[im 11/32]
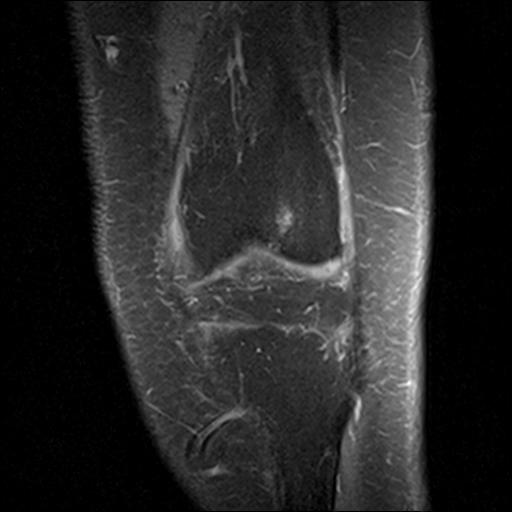
[im 16/32]
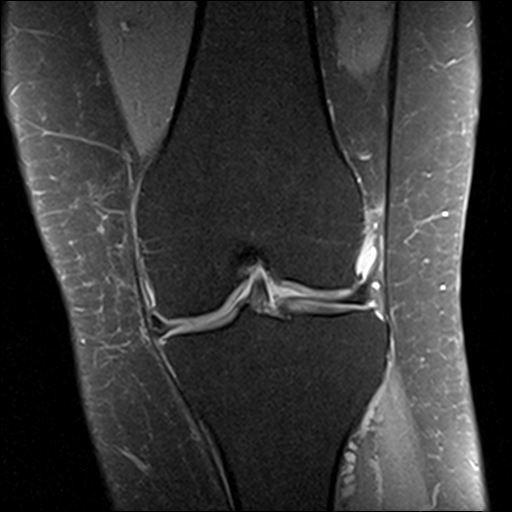
[im 21/32]
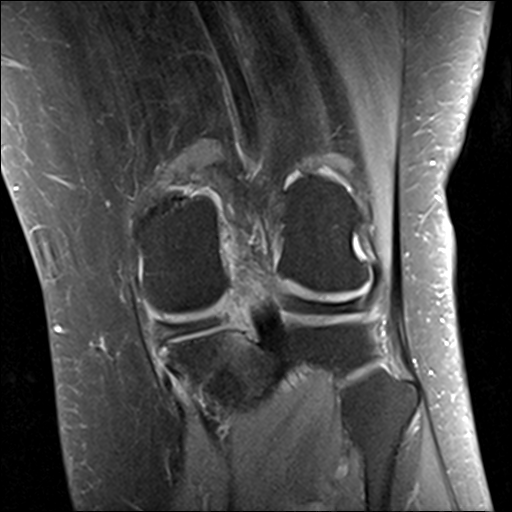
[im 26/32]
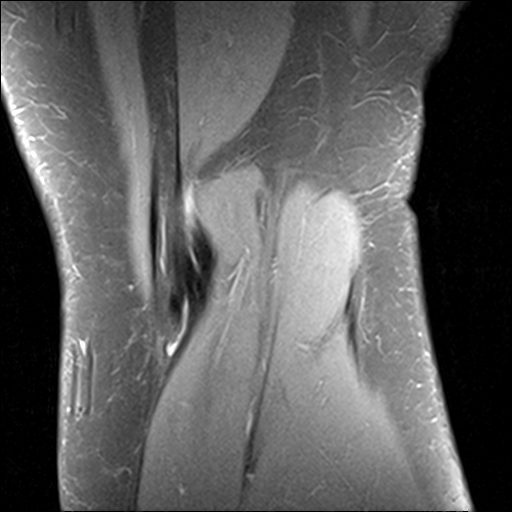
[im 32/32]
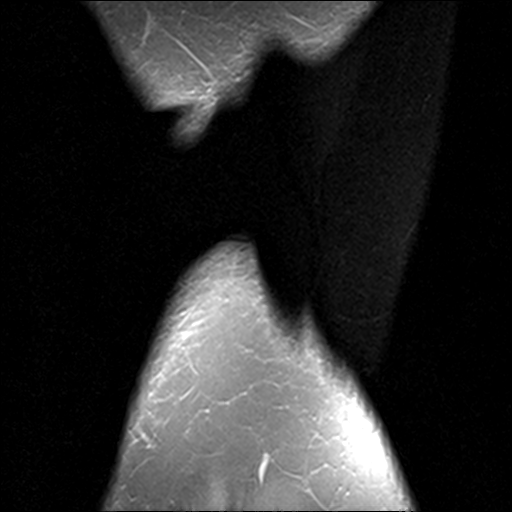

[Series 9: PD fat-sat · oblique · 2.3mm · 0.29mm/px · 3 of 11 slices shown (3 of 3)]
[im 1/11]
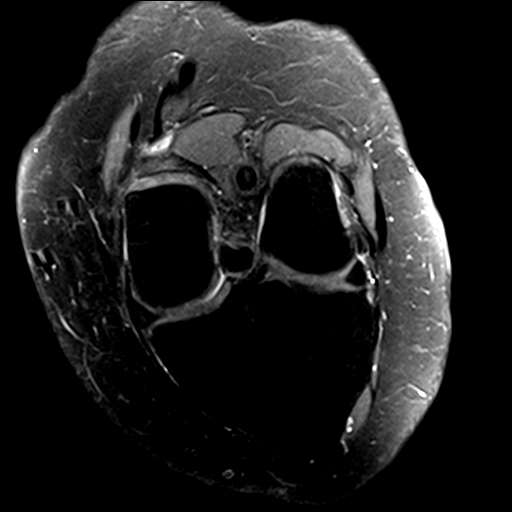
[im 6/11]
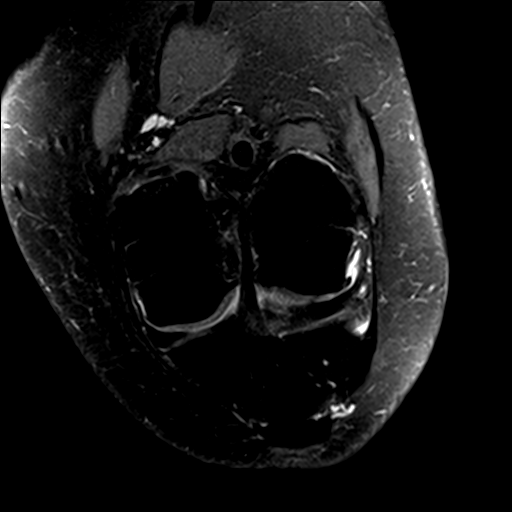
[im 11/11]
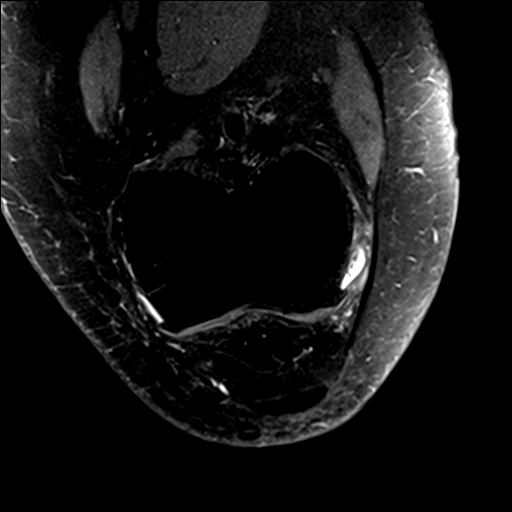

[22 of 40 positions shown; findings below may reference images not displayed]

FINDINGS: MENISCI

Medial meniscus: Complete radial tear of the posterior horn of the
medial meniscus near the posterior root attachment (series 7, image
17; series 8, image 12). Meniscus is mildly extruded with
intrasubstance degeneration of the posterior horn and body segments.

Lateral meniscus:  Intact.

LIGAMENTS

Cruciates:  Intact ACL and PCL.

Collaterals: Medial collateral ligament is intact. Lateral
collateral ligament complex is intact.

CARTILAGE

Patellofemoral: Extensive full-thickness cartilage loss involving
the lateral patellofemoral compartment with subchondral cystic
changes within the lateral patella and trochlea.

Medial: Mild chondral surface irregularity without full-thickness
cartilage defect.

Lateral:  No chondral defect.

Joint: No joint effusion. Mild edema within the superolateral aspect
of Hoffa's fat.

Popliteal Fossa:  No Baker cyst. Intact popliteus tendon.

Extensor Mechanism:  Intact quadriceps tendon and patellar tendon.

Bones: Focal subcortical marrow edema at the posterior aspect of the
medial tibial plateau underlying area of meniscal tear. Prominent
subchondral marrow signal changes within the lateral aspect of the
patellofemoral compartment. No acute fracture. No suspicious bone
lesion.

Other: None.
IMPRESSION: 1. Complete radial tear of the posterior horn of the medial meniscus
near the posterior root attachment.
2. Extensive full-thickness cartilage loss involving the lateral
patellofemoral compartment with associated subchondral signal
changes.
3. Mild edema within the superolateral aspect of Hoffa's fat pad,
which can be seen in the setting of patellar tendon-lateral femoral
condyle friction syndrome.

## 2022-05-15 ENCOUNTER — Telehealth: Payer: Self-pay | Admitting: Family Medicine

## 2022-05-15 ENCOUNTER — Emergency Department (HOSPITAL_BASED_OUTPATIENT_CLINIC_OR_DEPARTMENT_OTHER): Payer: Commercial Managed Care - PPO | Admitting: Radiology

## 2022-05-15 ENCOUNTER — Encounter (HOSPITAL_BASED_OUTPATIENT_CLINIC_OR_DEPARTMENT_OTHER): Payer: Self-pay

## 2022-05-15 ENCOUNTER — Emergency Department (HOSPITAL_BASED_OUTPATIENT_CLINIC_OR_DEPARTMENT_OTHER)
Admission: EM | Admit: 2022-05-15 | Discharge: 2022-05-15 | Disposition: A | Discharge status: Home / Self Care | Payer: Commercial Managed Care - PPO | Attending: Emergency Medicine | Admitting: Emergency Medicine

## 2022-05-15 ENCOUNTER — Other Ambulatory Visit: Payer: Self-pay

## 2022-05-15 ENCOUNTER — Other Ambulatory Visit (HOSPITAL_BASED_OUTPATIENT_CLINIC_OR_DEPARTMENT_OTHER): Payer: Self-pay

## 2022-05-15 DIAGNOSIS — E039 Hypothyroidism, unspecified: Secondary | ICD-10-CM | POA: Insufficient documentation

## 2022-05-15 DIAGNOSIS — R0602 Shortness of breath: Secondary | ICD-10-CM | POA: Diagnosis not present

## 2022-05-15 DIAGNOSIS — R002 Palpitations: Secondary | ICD-10-CM | POA: Diagnosis not present

## 2022-05-15 DIAGNOSIS — R079 Chest pain, unspecified: Secondary | ICD-10-CM | POA: Diagnosis not present

## 2022-05-15 LAB — BASIC METABOLIC PANEL
Anion gap: 7 (ref 5–15)
BUN: 14 mg/dL (ref 6–20)
CO2: 28 mmol/L (ref 22–32)
Calcium: 9.7 mg/dL (ref 8.9–10.3)
Chloride: 105 mmol/L (ref 98–111)
Creatinine, Ser: 0.67 mg/dL (ref 0.44–1.00)
GFR, Estimated: >60 mL/min (ref >60)
Glucose, Bld: 101 mg/dL — ABNORMAL HIGH (ref 70–99)
Potassium: 4.6 mmol/L (ref 3.5–5.1)
Sodium: 140 mmol/L (ref 135–145)

## 2022-05-15 LAB — CBC
HCT: 43.8 % (ref 36.0–46.0)
Hemoglobin: 14.5 g/dL (ref 12.0–15.0)
MCH: 31.8 pg (ref 26.0–34.0)
MCHC: 33.1 g/dL (ref 30.0–36.0)
MCV: 96.1 fL (ref 80.0–100.0)
Platelets: 234 10*3/uL (ref 150–400)
RBC: 4.56 MIL/uL (ref 3.87–5.11)
RDW: 12.9 % (ref 11.5–15.5)
WBC: 3.9 10*3/uL — ABNORMAL LOW (ref 4.0–10.5)
nRBC: 0 % (ref 0.0–0.2)

## 2022-05-15 LAB — TROPONIN I (HIGH SENSITIVITY)
Troponin I (High Sensitivity): <2 ng/L (ref <18)
Troponin I (High Sensitivity): <2 ng/L (ref <18)

## 2022-05-15 LAB — TSH: TSH: 0.903 u[IU]/mL (ref 0.350–4.500)

## 2022-05-15 NOTE — ED Triage Notes (Signed)
Pt reports having heart palpitations worse w movement- "not a lot, just some." Reports that husband "has [employer] & is physician & noticed irregular beats." Pt endorses associated lightheadedness, SHOB. Hx hypothyroidism, reports "typical hx of low BP."

## 2022-05-15 NOTE — ED Notes (Signed)
Patient ambulatory to bathroom. No complaints of chest pain, palpitations or shortness of breath. Patient and spouse requested to walk a few laps around the ED to see if symptoms returned. Gait steady.

## 2022-05-15 NOTE — ED Provider Notes (Signed)
[place] EMERGENCY DEPARTMENT AT [facility] Provider Note   CSN: [id number] Arrival date & time: 05/15/22  1006     History  Chief Complaint  Patient presents with   Palpitations    Jordan Juarez is a 62 y.o. female history of hypothyroidism presented with palpitations that began last night.  Patient notes that these palpitations occur after exerting self on walks and last for few hours in which she feels palpitations with lightheadedness but denies any other prodromal symptoms.  Patient's husband states that Apple Watch has been tracking ventricular bigeminy along with a heart rate in the 40s.  Patient states that she is currently asymptomatic.  Patient states the palpitations are not positional related.  Patient recently came back from [place] over the past few weeks.  Patient denies chest pain, shortness of breath, abdominal pain, nausea/vomiting, vision changes, LOC, changes in medication, cardiac history, recent illness, fevers, tinnitus, leg swelling, previous blood clots, blood thinners, personal history of cancer, hemoptysis, estrogen use  Home Medications Prior to Admission medications   Medication Sig Start Date End Date Taking? Authorizing Provider  acetaminophen (TYLENOL) 500 MG tablet Take 500 mg by mouth every 6 (six) hours as needed for pain.    [provider]  albuterol (VENTOLIN HFA) 108 (90 Base) MCG/ACT inhaler INHALE 2 PUFFS INTO THE LUNGS EVERY 6 HOURS AS NEEDED 12/22/20   Saguier, Ramon Dredge, PA-C  atovaquone-proguanil (MALARONE) 250-100 MG TABS tablet Take 1 tablet by mouth daily. 01/17/17   Bradd Canary, MD  benzonatate (TESSALON) 100 MG capsule Take 1 capsule (100 mg total) by mouth 3 (three) times daily as needed for cough. 11/30/20   Saguier, Ramon Dredge, PA-C  cetirizine (ZYRTEC) 10 MG tablet Take 10 mg by mouth daily.    [provider]  fluticasone (FLONASE) 50 MCG/ACT nasal spray Place 2 sprays into both nostrils daily. 11/30/20    Saguier, Ramon Dredge, PA-C  ibuprofen (ADVIL) 200 MG tablet Take 200 mg by mouth every 6 (six) hours as needed for pain.    [provider]  levothyroxine (SYNTHROID) 100 MCG tablet TAKE 1 TABLET BY MOUTH EVERY DAY BEFORE BREAKFAST 01/12/22   Bradd Canary, MD  meloxicam (MOBIC) 15 MG tablet TAKE 1 TABLET(15 MG) BY MOUTH DAILY 06/15/19   Judi Saa, DO  scopolamine (TRANSDERM-SCOP, 1.5 MG,) 1 MG/3DAYS Place 1 patch (1.5 mg total) onto the skin as needed. 05/08/16   Bradd Canary, MD  valACYclovir (VALTREX) 500 MG tablet Take 1 tablet (500 mg total) by mouth daily. 03/10/19   Bradd Canary, MD      Allergies    Other    Review of Systems   Review of Systems  Cardiovascular:  Positive for palpitations.    Physical Exam Updated Vital Signs BP 107/74   Pulse 80   Temp 98.4 F (36.9 C)   Resp 16   Ht 5\' 3"  (1.6 m)   Wt 67.1 kg   LMP 07/05/2010   SpO2 100%   BMI 26.22 kg/m  Physical Exam Vitals reviewed.  Constitutional:      General: She is not in acute distress. HENT:     Head: Normocephalic and atraumatic.  Eyes:     Extraocular Movements: Extraocular movements intact.     Conjunctiva/sclera: Conjunctivae normal.     Pupils: Pupils are equal, round, and reactive to light.  Cardiovascular:     Rate and Rhythm: Normal rate and regular rhythm.     Pulses: Normal pulses.  Heart sounds: Normal heart sounds.     Comments: 2+ bilateral radial/dorsalis pedis pulses with regular rate Pulmonary:     Effort: Pulmonary effort is normal. No respiratory distress.     Breath sounds: Normal breath sounds.  Abdominal:     Palpations: Abdomen is soft.     Tenderness: There is no abdominal tenderness. There is no guarding or rebound.  Musculoskeletal:        General: Normal range of motion.     Cervical back: Normal range of motion and neck supple.     Right lower leg: No edema.     Left lower leg: No edema.     Comments: 5 out of 5 bilateral grip/leg extension strength   Skin:    General: Skin is warm and dry.     Capillary Refill: Capillary refill takes less than 2 seconds.  Neurological:     General: No focal deficit present.     Mental Status: She is alert and oriented to person, place, and time.     Comments: Sensation intact in all 4 limbs  Psychiatric:        Mood and Affect: Mood normal.     ED Results / Procedures / Treatments   Labs (all labs ordered are listed, but only abnormal results are displayed) Labs Reviewed  BASIC METABOLIC PANEL - Abnormal; Notable for the following components:      Result Value   Glucose, Bld 101 (*)    All other components within normal limits  CBC - Abnormal; Notable for the following components:   WBC 3.9 (*)    All other components within normal limits  TSH  TROPONIN I (HIGH SENSITIVITY)  TROPONIN I (HIGH SENSITIVITY)    EKG EKG Interpretation  Date/Time:  Tuesday May 15 2022 10:18:43 EDT Ventricular Rate:  83 PR Interval:  164 QRS Duration: 84 QT Interval:  370 QTC Calculation: 434 R Axis:   94 Text Interpretation: Normal sinus rhythm Rightward axis Borderline ECG No previous ECGs available ST elevation in inferior leads - no reciprocal changes No old tracing to compare Confirmed by Derwood Kaplan [phone]) on 05/15/2022 12:10:27 PM  Radiology DG Chest 2 View  Result Date: 05/15/2022 CLINICAL DATA:  Chest pain and shortness of breath EXAM: CHEST - 2 VIEW COMPARISON:  None Available. FINDINGS: The cardiomediastinal silhouette is unremarkable. There is no evidence of focal airspace disease, pulmonary edema, suspicious pulmonary nodule/mass, pleural effusion, or pneumothorax. No acute bony abnormalities are identified. IMPRESSION: No active cardiopulmonary disease. Electronically Signed   By: Jordan Juarez M.D.   On: 05/15/2022 11:07    Procedures Procedures    Medications Ordered in ED Medications - No data to display  ED Course/ Medical Decision Making/ A&P                              Medical Decision Making Amount and/or Complexity of Data Reviewed Labs: ordered. Radiology: ordered.   Jordan Juarez 62 y.o. presented today for palpitations. Working DDx that I considered at this time includes, but not limited to, hyperthyroidism, medication induced, orthostatic, anemia, electrolyte abnormalities, arrhythmia, valvular disease, anxiety, PE.  R/o DDx: hyperthyroidism, medication induced, orthostatic, anemia, electrolyte abnormalities, arrhythmia, valvular disease, anxiety, PE: These are considered less likely due to history of present illness and physical exam findings  Review of prior external notes: 06/13/2021 office visit  Unique Tests and My Interpretation:  Troponin: Less than 2, less than 2  CBC: Unremarkable BMP: Unremarkable TSH: Unremarkable Chest x-ray: No acute cardiopulmonary changes EKG: Sinus 83 bpm, mild ST elevation in inferior leads without reciprocal changes, no blocks noted  Discussion with Independent Historian:  Husband  Discussion of Management of Tests: None  Risk: Low: based on diagnostic testing/clinical impression and treatment plan  Risk Stratification Score: None  Plan: Patient presented for palpitations. On exam patient was in no acute distress and stable vitals.  Patient's physical exam was unremarkable and husband patient stated that symptoms only occur after walking around.  Labs and imaging will be obtained along with a TSH and patient will be walked and then monitored.  Patient will most likely need Holter monitor via primary care.  Patient stable at this time.  Patient was walked extensively and EKG was unchanging.  Patient did not endorse any palpitations after being walked and states she is still asymptomatic.  Pending on patient's TSH patient may be discharged with outpatient follow-up as described above is right now her labs and imaging of all been reassuring.  Patient stated that she is comfortable with this plan pending  TSH.  Patient's TSH and second troponin were both reassuring.  After updating patient and husband of the results both stated they are comfortable being discharged with outpatient follow-up to get a Holter monitor.  Patient encouraged patient to monitor symptoms and husband stated he has a primary care physician and will also continue to monitor symptoms.  At time of discharge patient has been asymptomatic throughout ED course of the past 4 hours along with a reassuring physical exam, labs, imaging.  Patient was given return precautions. Patient stable for discharge at this time.  Patient verbalized understanding of plan.         Final Clinical Impression(s) / ED Diagnoses Final diagnoses:  Palpitations    Rx / DC Orders ED Discharge Orders     None         Remi Deter 05/15/22 1402    Derwood Kaplan, MD 05/16/22 1110

## 2022-05-15 NOTE — Discharge Instructions (Signed)
Please follow-up with your primary care provider regarding recent symptoms and ER visit.  Today your labs and imaging were all reassuring and after being walked you remain asymptomatic.  You will need to speak to your primary care provider about the possibility of a Holter monitor for your Palpitations.  Please monitor your symptoms and if they worsen please return to ER.

## 2022-05-15 NOTE — Telephone Encounter (Signed)
Pt called to schedule ED follow up with DR. Abner Greenspan. Advised that DR. Abner Greenspan does not have any openings until August but we can schedule her with one of Dr Mariel Aloe NP but pt declined and said she would go another route

## 2022-05-17 ENCOUNTER — Ambulatory Visit: Payer: Commercial Managed Care - PPO | Admitting: Family Medicine

## 2022-05-17 VITALS — BP 120/60 | HR 70 | Temp 98.1°F | Ht 63.0 in | Wt 151.5 lb

## 2022-05-17 DIAGNOSIS — E782 Mixed hyperlipidemia: Secondary | ICD-10-CM

## 2022-05-17 DIAGNOSIS — R002 Palpitations: Secondary | ICD-10-CM | POA: Insufficient documentation

## 2022-05-17 NOTE — Progress Notes (Signed)
Established Patient Office Visit  Subjective   Patient ID: Jordan Juarez, female    DOB: 10/12/1960  Age: 62 y.o. MRN: [id number]  Chief Complaint  Patient presents with   Hospitalization Follow-up    Patient is here for ER follow up. States that it is happening when she has been exertional, states that it was concerning enough, much more frequently, positive for dizziness and lightheadedness associated with the exertion. States that when she is sitting she is not having any symptoms. States that she even stopped drinking caffeine for the past few days, but the palpitations were still happening.  She denies any other symptoms, state she is eating and drinking well, likes to exercise regularly. I reviewed the notes and imaging, labs and EKG from the ED visit with the patient.  She reports a family history of heart disease in her mother around this age, we will need a new lipid panel to reassess cardiac risk.     Current Outpatient Medications  Medication Instructions   cetirizine (ZYRTEC) 10 mg, Daily   levothyroxine (SYNTHROID) 100 MCG tablet TAKE 1 TABLET BY MOUTH EVERY DAY BEFORE BREAKFAST    Patient Active Problem List   Diagnosis Date Noted   Heart palpitations 05/17/2022   Right knee pain 05/18/2019   Right foot pain 03/10/2019   Hyperlipidemia, mixed 08/11/2015   Overweight 08/08/2014   Hypothyroidism 06/03/2012   Allergic state 06/03/2012   Preventative health care 06/03/2012   Thyroid nodule 06/03/2012   Sun-damaged skin 06/03/2012   Anemia 06/03/2012   Breast lesion on mammography 06/03/2012      Review of Systems  Cardiovascular:  Positive for palpitations. Negative for chest pain, orthopnea and leg swelling.  Musculoskeletal:  Negative for falls.  Neurological:  Positive for dizziness.  All other systems reviewed and are negative.     Objective:     BP 120/60 (BP Location: Left Arm, Patient Position: Sitting, Cuff Size: Normal)   Pulse 70   Temp 98.1  F (36.7 C) (Oral)   Ht 5\' 3"  (1.6 m)   Wt 151 lb 8 oz (68.7 kg)   LMP 07/05/2010   SpO2 98%   BMI 26.84 kg/m    Physical Exam Vitals reviewed.  Constitutional:      Appearance: Normal appearance. She is well-groomed and normal weight.  Neck:     Thyroid: No thyromegaly.  Cardiovascular:     Rate and Rhythm: Normal rate and regular rhythm.     Pulses: Normal pulses.     Heart sounds: S1 normal and S2 normal.  Pulmonary:     Effort: Pulmonary effort is normal.     Breath sounds: Normal breath sounds and air entry.  Musculoskeletal:     Right lower leg: No edema.     Left lower leg: No edema.  Neurological:     Mental Status: She is alert and oriented to person, place, and time. Mental status is at baseline.     Gait: Gait is intact.  Psychiatric:        Mood and Affect: Mood and affect normal.        Speech: Speech normal.        Behavior: Behavior normal.      No results found for any visits on 05/17/22.    The 10-year ASCVD risk score (Arnett DK, et al., 2019) is: 2.4%    Assessment & Plan:  Heart palpitations Assessment & Plan: ER visit showed no acute findings. Notes, EKG and labs reviewed.  I have ordered a 14 day cardiac monitor and a referral to cardiology for additional testing including possible ECHO and stress test. Ordered lipid panel to reassess her cardiac risk. RTC in 6 months for earlier if needed,   Orders: -     LONG TERM MONITOR (3-14 DAYS); Future -     Ambulatory referral to Cardiology  Hyperlipidemia, mixed -     Lipid panel; Future     Return in about 6 months (around 11/17/2022) for TSH.    Karie Georges, MD

## 2022-05-17 NOTE — Assessment & Plan Note (Signed)
ER visit showed no acute findings. Notes, EKG and labs reviewed. I have ordered a 14 day cardiac monitor and a referral to cardiology for additional testing including possible ECHO and stress test. Ordered lipid panel to reassess her cardiac risk. RTC in 6 months for earlier if needed,

## 2022-05-18 ENCOUNTER — Other Ambulatory Visit (INDEPENDENT_AMBULATORY_CARE_PROVIDER_SITE_OTHER): Payer: Commercial Managed Care - PPO

## 2022-05-18 DIAGNOSIS — E782 Mixed hyperlipidemia: Secondary | ICD-10-CM

## 2022-05-18 LAB — LIPID PANEL
Cholesterol: 209 mg/dL — ABNORMAL HIGH (ref 0–200)
HDL: 65 mg/dL (ref >39.00)
LDL Cholesterol: 133 mg/dL — ABNORMAL HIGH (ref 0–99)
NonHDL: 143.82
Total CHOL/HDL Ratio: 3
Triglycerides: 53 mg/dL (ref 0.0–149.0)
VLDL: 10.6 mg/dL (ref 0.0–40.0)

## 2022-05-21 ENCOUNTER — Telehealth: Payer: Self-pay | Admitting: Internal Medicine

## 2022-05-21 NOTE — Telephone Encounter (Signed)
Pt will be a NP to provider to provider as of 7/1. Pt's husband whom is a doctor would like a callback to make provider aware of some issues pt is having. Please advise

## 2022-05-21 NOTE — Telephone Encounter (Signed)
Received call from patient/patient husband who states that patient has been having some exertional palpitations, feels like patient may be in ventricular bigeminy at times HR 48 on apple watch. Reports that she went to ER due to severe onset and symptoms last week. Patient is wearing Zio monitor ordered by PCP- states they are concerned due to nature of palpitations and symptoms with exertion. Scheduled patient to see Dr. Flora Lipps Wednesday this week 5/22- and then patient will follow up with Dr. Jacques Navy going forward from there. Patient aware of appointment time and date.

## 2022-05-23 ENCOUNTER — Ambulatory Visit: Payer: Commercial Managed Care - PPO | Attending: Cardiovascular Disease | Admitting: Cardiovascular Disease

## 2022-05-23 ENCOUNTER — Encounter: Payer: Self-pay | Admitting: Cardiovascular Disease

## 2022-05-23 VITALS — BP 120/82 | HR 63 | Ht 63.0 in | Wt 153.6 lb

## 2022-05-23 DIAGNOSIS — I493 Ventricular premature depolarization: Secondary | ICD-10-CM | POA: Diagnosis not present

## 2022-05-23 DIAGNOSIS — R0602 Shortness of breath: Secondary | ICD-10-CM

## 2022-05-23 DIAGNOSIS — R072 Precordial pain: Secondary | ICD-10-CM

## 2022-05-23 DIAGNOSIS — R002 Palpitations: Secondary | ICD-10-CM

## 2022-05-23 NOTE — Progress Notes (Signed)
Cardiology Office Note:   Date:  05/23/2022  NAME:  Jordan Juarez    MRN: [id number] DOB:  1960/10/17   PCP:  Karie Georges, MD  Cardiologist:  None  Electrophysiologist:  None   Referring MD: Karie Georges, MD   Chief Complaint  Patient presents with   Palpitations         History of Present Illness:   Jordan Juarez is a 62 y.o. female with a hx of hypothyroidism who is being seen today for the evaluation of palpitations at the request of Karie Georges, MD. the patient was seen in the emergency room on 05/15/2022 with symptoms of palpitations and lightheadedness.  Apple Watch showed ventricular bigeminy.  Labs showed normal kidney function.  Troponins were negative x 2.  Total cholesterol 209, HDL 65, LDL 133, TG 53  She reports for the past 7 to 10 days she has had palpitations with activity.  She describes a fluttering her chest.  She actually has Apple Watch tracings which show ventricular bigeminy.  It does occur with activity.  She does get short of breath.  She also describes tightness.  Symptoms resolved with cessation of activity.  She reports her heart is not racing otherwise.  She reports no increase stress.  Her husband did just have open heart surgery but she is doing well with this.  She has never had a heart attack or stroke.  Her EKG does demonstrate sinus rhythm with no acute ischemic changes.  Recent lab work shows normal thyroid studies.  TSH 0.90.  Normal kidney function.  She does report heart disease in her mother.  She does not smoke.  No alcohol or drug use was reported.  She is a retired Engineer, site.  She presents with her husband who is an internal medicine physician in town.  She is quite concerned about her symptoms.  They are new.  She has never had these before.  No new medications.  No recent change in medical history  Past Medical History: Past Medical History:  Diagnosis Date   Allergic state 06/03/2012   Anemia 06/03/2012   Breast  lesion on mammography 06/03/2012   S/p biopsy on left with marker left behind, benign   Chicken pox as a child   Hyperlipidemia, mixed 08/11/2015   Hypothyroidism    Overweight 08/08/2014   PONV (postoperative nausea and vomiting)    Preventative health care 06/03/2012   Sun-damaged skin 06/03/2012   Thyroid nodule 06/03/2012   Unspecified hypothyroidism 06/03/2012    Past Surgical History: Past Surgical History:  Procedure Laterality Date   ABDOMINAL HYSTERECTOMY  07/13/2010   still has ovaries   BREAST BIOPSY  2011   BREAST SURGERY     left breast biopsies   ENDOMETRIAL ABLATION  2009   done in office- n&v postop   KNEE SURGERY     LAPAROSCOPIC ASSISTED VAGINAL HYSTERECTOMY  07/13/2010   Procedure: LAPAROSCOPIC ASSISTED VAGINAL HYSTERECTOMY;  Surgeon: Meriel Pica;  Location: [facility];  Service: Gynecology;  Laterality: N/A;   MOLE REMOVAL Right    forehead, 4th grade    Current Medications: Current Meds  Medication Sig   cetirizine (ZYRTEC) 10 MG tablet Take 10 mg by mouth daily.   levothyroxine (SYNTHROID) 100 MCG tablet TAKE 1 TABLET BY MOUTH EVERY DAY BEFORE BREAKFAST     Allergies:    Other   Social History: Social History   Socioeconomic History   Marital status: Married    Spouse name:  Not on file   Number of children: 3   Years of education: Not on file   Highest education level: Not on file  Occupational History   Not on file  Tobacco Use   Smoking status: Never   Smokeless tobacco: Never  Vaping Use   Vaping Use: Never used  Substance and Sexual Activity   Alcohol use: Yes    Alcohol/week: 2.0 standard drinks of alcohol    Types: 2 Glasses of wine per week    Comment: occ   Drug use: No   Sexual activity: Yes  Other Topics Concern   Not on file  Social History Narrative   Not on file   Social Determinants of Health   Financial Resource Strain: Not on file  Food Insecurity: Not on file  Transportation Needs: Not on file  Physical Activity: Not  on file  Stress: Not on file  Social Connections: Not on file     Family History: The patient's family history includes Asthma in her son; Cancer in her daughter, father, maternal grandmother, and paternal grandfather; Dementia in her father; Depression in her sister; Diabetes in her maternal grandmother; Emphysema in her mother; Heart attack in her mother; Hypertension in her father and maternal grandfather; Leukemia in her sister; Stroke in her maternal grandfather; Thyroid disease in her mother and sister. There is no history of Colon cancer, Rectal cancer, or Stomach cancer.  ROS:   All other ROS reviewed and negative. Pertinent positives noted in the HPI.     EKGs/Labs/Other Studies Reviewed:   The following studies were personally reviewed by me today:  EKG:  EKG is ordered today.  The ekg ordered today demonstrates normal sinus rhythm heart rate 63, no acute ischemic changes or evidence of infarction, and was personally reviewed by me.   Recent Labs: 05/15/2022: BUN 14; Creatinine, Ser 0.67; Hemoglobin 14.5; Platelets 234; Potassium 4.6; Sodium 140; TSH 0.903   Recent Lipid Panel    Component Value Date/Time   CHOL 209 (H) 05/18/2022 1007   TRIG 53.0 05/18/2022 1007   HDL 65.00 05/18/2022 1007   CHOLHDL 3 05/18/2022 1007   VLDL 10.6 05/18/2022 1007   LDLCALC 133 (H) 05/18/2022 1007   LDLCALC 119 (H) 09/21/2019 1110    Physical Exam:   VS:  BP 120/82   Pulse 63   Ht 5\' 3"  (1.6 m)   Wt 153 lb 9.6 oz (69.7 kg)   LMP 07/05/2010   SpO2 95%   BMI 27.21 kg/m    Wt Readings from Last 3 Encounters:  05/23/22 153 lb 9.6 oz (69.7 kg)  05/17/22 151 lb 8 oz (68.7 kg)  05/15/22 148 lb (67.1 kg)    General: Well nourished, well developed, in no acute distress Head: Atraumatic, normal size  Eyes: PEERLA, EOMI  Neck: Supple, no JVD Endocrine: No thryomegaly Cardiac: Normal S1, S2; RRR; no murmurs, rubs, or gallops Lungs: Clear to auscultation bilaterally, no wheezing, rhonchi  or rales  Abd: Soft, nontender, no hepatomegaly  Ext: No edema, pulses 2+ Musculoskeletal: No deformities, BUE and BLE strength normal and equal Skin: Warm and dry, no rashes   Neuro: Alert and oriented to person, place, time, and situation, CNII-XII grossly intact, no focal deficits  Psych: Normal mood and affect   ASSESSMENT:   Eleana Garee is a 62 y.o. female who presents for the following: 1. Palpitations   2. SOB (shortness of breath) on exertion   3. PVC (premature ventricular contraction)   4.  Precordial pain     PLAN:   1. Palpitations 2. SOB (shortness of breath) on exertion 3. PVC (premature ventricular contraction) 4. Precordial pain -PVCs captured with exertion.  Pattern bigeminy based on Apple Watch tracings.  Symptoms do not occur with rest.  EKG is normal.  CV exam is normal.  She does describe shortness of breath and tightness in her chest.  Could be simply related to feeling her heart fluttering.  I recommended coronary CTA for definitive coronary evaluation.  Heart rate 63.  No beta-blocker needed.  She has had recent labs.  She also needs an echocardiogram given exertional PVCs.  I also think an exercise treadmill test will be good to see what her heart rhythm does when she exerts herself.  We will set her up for this.  She also has a monitor ordered by her primary care physician.  We can follow-up on all of this.  We should be able to get her CT scan done tomorrow.  We will proceed with workup of exertional PVCs as above.  All of her labs seem to be within limits.  No concerns for sleep apnea.  She will follow with Dr. Jacques Navy in 3 months.   Shared Decision Making/Informed Consent The risks [chest pain, shortness of breath, cardiac arrhythmias, dizziness, blood pressure fluctuations, myocardial infarction, stroke/transient ischemic attack, and life-threatening complications (estimated to be 1 in 10,000)], benefits (risk stratification, diagnosing coronary artery  disease, treatment guidance) and alternatives of an exercise tolerance test were discussed in detail with Ms. Harewood and she agrees to proceed.  Disposition: Return in about 3 months (around 08/23/2022).  Medication Adjustments/Labs and Tests Ordered: Current medicines are reviewed at length with the patient today.  Concerns regarding medicines are outlined above.  Orders Placed This Encounter  Procedures   CT CORONARY MORPH W/CTA COR W/SCORE W/CA W/CM &/OR WO/CM   Cardiac Stress Test: Informed Consent Details: Physician/Practitioner Attestation; Transcribe to consent form and obtain patient signature   EXERCISE TOLERANCE TEST (ETT)   EKG 12-Lead   ECHOCARDIOGRAM COMPLETE   No orders of the defined types were placed in this encounter.   Patient Instructions  Medication Instructions:  The current medical regimen is effective;  continue present plan and medications.  *If you need a refill on your cardiac medications before your next appointment, please call your pharmacy*   Testing/Procedures: Coronary CTA  Echocardiogram - Your physician has requested that you have an echocardiogram. Echocardiography is a painless test that uses sound waves to create images of your heart. It provides your doctor with information about the size and shape of your heart and how well your heart's chambers and valves are working. This procedure takes approximately one hour. There are no restrictions for this procedure.   Your physician has requested that you have an exercise tolerance test, this is a screening tool to track your fitness level. This test evaluates the your exercise capacity by measuring cardiovascular response to exercise, the stress response is induced by exercise (exercise-treadmill).  Graded exercise test is also known as maximal exercise test or stress EKG test  . Please also follow instruction sheet given.    Follow-Up: At [facility], you and your health needs are our  priority.  As part of our continuing mission to provide you with exceptional heart care, we have created designated Provider Care Teams.  These Care Teams include your primary Cardiologist (physician) and Advanced Practice Providers (APPs -  Physician Assistants and Nurse Practitioners) who all  work together to provide you with the care you need, when you need it.  We recommend signing up for the patient portal called "MyChart".  Sign up information is provided on this After Visit Summary.  MyChart is used to connect with patients for Virtual Visits (Telemedicine).  Patients are able to view lab/test results, encounter notes, upcoming appointments, etc.  Non-urgent messages can be sent to your provider as well.   To learn more about what you can do with MyChart, go to [email or web address].    Your next appointment:   3 month(s)  Provider:   Dr.Acharya   Other Instructions   Your cardiac CT will be scheduled at one of the below locations:   [facility] [address] [place], [place] [address] [phone]   If scheduled at [facility], please arrive at the [facility] and Children's Entrance (Entrance C2) of [facility] 30 minutes prior to test start time. You can use the FREE valet parking offered at entrance C (encouraged to control the heart rate for the test)  Proceed to the [facility] Radiology Department (first floor) to check-in and test prep.  All radiology patients and guests should use entrance C2 at [facility], accessed from [facility], even though the hospital's physical address listed is [address]     Please follow these instructions carefully (unless otherwise directed):   On the Night Before the Test: Be sure to Drink plenty of water. Do not consume any caffeinated/decaffeinated beverages or chocolate 12 hours prior to your test. Do not take any antihistamines 12 hours prior to your test.  On the  Day of the Test: Drink plenty of water until 1 hour prior to the test. Do not eat any food 1 hour prior to test. You may take your regular medications prior to the test.  Take metoprolol (Lopressor) two hours prior to test. If you take Furosemide/Hydrochlorothiazide/Spironolactone, please HOLD on the morning of the test. FEMALES- please wear underwire-free bra if available, avoid dresses & tight clothing    After the Test: Drink plenty of water. After receiving IV contrast, you may experience a mild flushed feeling. This is normal. On occasion, you may experience a mild rash up to 24 hours after the test. This is not dangerous. If this occurs, you can take Benadryl 25 mg and increase your fluid intake. If you experience trouble breathing, this can be serious. If it is severe call 911 IMMEDIATELY. If it is mild, please call our office. If you take any of these medications: Glipizide/Metformin, Avandament, Glucavance, please do not take 48 hours after completing test unless otherwise instructed.  We will call to schedule your test 2-4 weeks out understanding that some insurance companies will need an authorization prior to the service being performed.   For non-scheduling related questions, please contact the cardiac imaging nurse navigator should you have any questions/concerns: Rockwell Alexandria, Cardiac Imaging Nurse Navigator Larey Brick, Cardiac Imaging Nurse Navigator [facility] Direct Office Dial: [phone]   For scheduling needs, including cancellations and rescheduling, please call [place], [phone].      Signed, Lenna Gilford. Flora Lipps, MD, [facility]  [facility]  [address] [place], [place] [address] [phone]  05/23/2022 3:34 PM

## 2022-05-23 NOTE — Patient Instructions (Signed)
Medication Instructions:  The current medical regimen is effective;  continue present plan and medications.  *If you need a refill on your cardiac medications before your next appointment, please call your pharmacy*   Testing/Procedures: Coronary CTA  Echocardiogram - Your physician has requested that you have an echocardiogram. Echocardiography is a painless test that uses sound waves to create images of your heart. It provides your doctor with information about the size and shape of your heart and how well your heart's chambers and valves are working. This procedure takes approximately one hour. There are no restrictions for this procedure.   Your physician has requested that you have an exercise tolerance test, this is a screening tool to track your fitness level. This test evaluates the your exercise capacity by measuring cardiovascular response to exercise, the stress response is induced by exercise (exercise-treadmill).  Graded exercise test is also known as maximal exercise test or stress EKG test  . Please also follow instruction sheet given.    Follow-Up: At [facility], you and your health needs are our priority.  As part of our continuing mission to provide you with exceptional heart care, we have created designated Provider Care Teams.  These Care Teams include your primary Cardiologist (physician) and Advanced Practice Providers (APPs -  Physician Assistants and Nurse Practitioners) who all work together to provide you with the care you need, when you need it.  We recommend signing up for the patient portal called "MyChart".  Sign up information is provided on this After Visit Summary.  MyChart is used to connect with patients for Virtual Visits (Telemedicine).  Patients are able to view lab/test results, encounter notes, upcoming appointments, etc.  Non-urgent messages can be sent to your provider as well.   To learn more about what you can do with MyChart, go to  [email or web address].    Your next appointment:   3 month(s)  Provider:   Dr.Acharya   Other Instructions   Your cardiac CT will be scheduled at one of the below locations:   [facility] [address] [place], [place] [address] [phone]   If scheduled at [facility], please arrive at the [facility] and Children's Entrance (Entrance C2) of [facility] 30 minutes prior to test start time. You can use the FREE valet parking offered at entrance C (encouraged to control the heart rate for the test)  Proceed to the [facility] Radiology Department (first floor) to check-in and test prep.  All radiology patients and guests should use entrance C2 at [facility], accessed from [facility], even though the hospital's physical address listed is [address].     Please follow these instructions carefully (unless otherwise directed):   On the Night Before the Test: Be sure to Drink plenty of water. Do not consume any caffeinated/decaffeinated beverages or chocolate 12 hours prior to your test. Do not take any antihistamines 12 hours prior to your test.  On the Day of the Test: Drink plenty of water until 1 hour prior to the test. Do not eat any food 1 hour prior to test. You may take your regular medications prior to the test.  Take metoprolol (Lopressor) two hours prior to test. If you take Furosemide/Hydrochlorothiazide/Spironolactone, please HOLD on the morning of the test. FEMALES- please wear underwire-free bra if available, avoid dresses & tight clothing    After the Test: Drink plenty of water. After receiving IV contrast, you may experience a  mild flushed feeling. This is normal. On occasion, you may experience a mild rash up to 24 hours after the test. This is not dangerous. If this occurs, you can take Benadryl 25 mg and increase your fluid intake. If you experience trouble breathing, this can be serious. If  it is severe call 911 IMMEDIATELY. If it is mild, please call our office. If you take any of these medications: Glipizide/Metformin, Avandament, Glucavance, please do not take 48 hours after completing test unless otherwise instructed.  We will call to schedule your test 2-4 weeks out understanding that some insurance companies will need an authorization prior to the service being performed.   For non-scheduling related questions, please contact the cardiac imaging nurse navigator should you have any questions/concerns: Rockwell Alexandria, Cardiac Imaging Nurse Navigator Larey Brick, Cardiac Imaging Nurse Navigator [facility] Direct Office Dial: [phone]   For scheduling needs, including cancellations and rescheduling, please call [place], [phone].

## 2022-05-24 ENCOUNTER — Telehealth: Payer: Self-pay | Admitting: Family Medicine

## 2022-05-24 ENCOUNTER — Ambulatory Visit (HOSPITAL_COMMUNITY)
Admission: RE | Admit: 2022-05-24 | Discharge: 2022-05-24 | Disposition: A | Discharge status: Home / Self Care | Payer: Commercial Managed Care - PPO | Source: Ambulatory Visit | Attending: Cardiovascular Disease | Admitting: Cardiovascular Disease

## 2022-05-24 DIAGNOSIS — R072 Precordial pain: Secondary | ICD-10-CM | POA: Diagnosis not present

## 2022-05-24 MED ORDER — NITROGLYCERIN 0.4 MG SL SUBL
SUBLINGUAL_TABLET | SUBLINGUAL | Status: AC
  Filled 2022-05-24: qty 2

## 2022-05-24 MED ORDER — NITROGLYCERIN 0.4 MG SL SUBL
0.8000 mg | SUBLINGUAL_TABLET | Freq: Once | SUBLINGUAL | Status: AC
  Administered 2022-05-24: 0.8 mg via SUBLINGUAL

## 2022-05-24 MED ORDER — IOHEXOL 350 MG/ML SOLN
95.0000 mL | Freq: Once | INTRAVENOUS | Status: AC | PRN
  Administered 2022-05-24: 95 mL via INTRAVENOUS

## 2022-05-24 NOTE — Telephone Encounter (Signed)
Message sent to PCP as I do not see an order for a heart monitor

## 2022-05-24 NOTE — Telephone Encounter (Signed)
Spoke with Chloe at [facility] [phone]), she stated the order was received and they are awaiting insurance approval.  I called the patient and informed her of this also.

## 2022-05-24 NOTE — Telephone Encounter (Signed)
Patient states a heart monitor was ordered for her and she has not heard anything from the company stating it was on it's way. Please advise.

## 2022-05-24 NOTE — Telephone Encounter (Signed)
I placed the order on 5/14 and put home enrollment, selected the company [employer]-- is this not the correct way to order?

## 2022-06-04 ENCOUNTER — Telehealth (HOSPITAL_COMMUNITY): Payer: Self-pay

## 2022-06-04 NOTE — Telephone Encounter (Signed)
Spoke with the patient, detailed instructions given. She stated that she would be here for her test. Asked to call back with any questions. Jordan Juarez EMTP/CCT 

## 2022-06-05 ENCOUNTER — Ambulatory Visit (HOSPITAL_COMMUNITY): Payer: Commercial Managed Care - PPO | Attending: Cardiology

## 2022-06-05 DIAGNOSIS — R002 Palpitations: Secondary | ICD-10-CM

## 2022-06-05 LAB — EXERCISE TOLERANCE TEST
Estimated workload: 11.7
Exercise duration (min): 10 min
Exercise duration (sec): 2 s
MPHR: 160 {beats}/min
Peak HR: 190 {beats}/min
Percent HR: 118 %
Rest HR: 68 {beats}/min

## 2022-06-07 ENCOUNTER — Ambulatory Visit: Payer: Commercial Managed Care - PPO | Attending: Cardiovascular Disease

## 2022-06-07 ENCOUNTER — Encounter: Payer: Self-pay | Admitting: Cardiovascular Disease

## 2022-06-07 DIAGNOSIS — R002 Palpitations: Secondary | ICD-10-CM

## 2022-06-07 NOTE — Progress Notes (Unsigned)
Enrolled patient for a 14 day [employer] XT  monitor to be mailed to patients home  °

## 2022-06-09 DIAGNOSIS — R002 Palpitations: Secondary | ICD-10-CM

## 2022-06-19 ENCOUNTER — Ambulatory Visit (HOSPITAL_BASED_OUTPATIENT_CLINIC_OR_DEPARTMENT_OTHER)
Admission: RE | Admit: 2022-06-19 | Discharge: 2022-06-19 | Disposition: A | Discharge status: Home / Self Care | Payer: Commercial Managed Care - PPO | Source: Ambulatory Visit | Attending: Cardiovascular Disease | Admitting: Cardiovascular Disease

## 2022-06-19 DIAGNOSIS — R0602 Shortness of breath: Secondary | ICD-10-CM

## 2022-06-20 LAB — ECHOCARDIOGRAM COMPLETE
Area-P 1/2: 3.06 cm2
S' Lateral: 2.4 cm

## 2022-06-30 DIAGNOSIS — R002 Palpitations: Secondary | ICD-10-CM | POA: Diagnosis not present

## 2022-07-02 ENCOUNTER — Encounter: Payer: Self-pay | Admitting: Internal Medicine

## 2022-07-02 ENCOUNTER — Ambulatory Visit: Payer: Commercial Managed Care - PPO | Attending: Internal Medicine | Admitting: Internal Medicine

## 2022-07-02 VITALS — BP 124/72 | HR 75 | Ht 63.0 in | Wt 154.8 lb

## 2022-07-02 DIAGNOSIS — E039 Hypothyroidism, unspecified: Secondary | ICD-10-CM | POA: Diagnosis not present

## 2022-07-02 DIAGNOSIS — I493 Ventricular premature depolarization: Secondary | ICD-10-CM

## 2022-07-02 DIAGNOSIS — R002 Palpitations: Secondary | ICD-10-CM

## 2022-07-02 DIAGNOSIS — R072 Precordial pain: Secondary | ICD-10-CM | POA: Diagnosis not present

## 2022-07-02 DIAGNOSIS — R0602 Shortness of breath: Secondary | ICD-10-CM | POA: Diagnosis not present

## 2022-07-02 NOTE — Progress Notes (Signed)
Cardiology Office Note:   Date:  07/02/2022  NAME:  Jordan Juarez    MRN: [id number] DOB:  [birth year]   PCP:  Karie Georges, MD  Cardiologist:  None Dr. Jacques Navy Electrophysiologist:  None   Referring MD: Karie Georges, MD   Cc: Palpitations  History of Present Illness:   Jordan Juarez is a 62 y.o. female with a hx of hypothyroidism who is being seen today in follow-up.  The patient is feeling better and has had 3-4 brief episodes of palpitations in the last week or 2.  She has triggered her watch about 3 times.  Usually the interpretation is poor data quality.  On 1 occasion the monitor read atrial fibrillation but her husband who is a physician reviewed this and felt that was less likely.  We reviewed the cardiac monitor which resulted just prior to this consultation, overall findings are most suggestive of artifact.  There is one 3 beat episode of nonsustained ventricular tachycardia.  It is documented in the monitor as a longer 7-8 beat run, but on personal interpretation this seems more consistent with artifact.  There are also episodes described as SVT, however those also appear most consistent with artifact.  Symptoms correlate most closely with PVCs and PVCs and bigeminy.  Patient notes that just prior to wearing the monitor she had several longer episodes of palpitations.  She has made modifications in her caffeine consumption but feels this is not in correlation with an increase or decrease in symptoms.  She has resumed her normal caffeine intake with no change in her palpitations in fact things may be improving.  Unclear etiology of her symptoms as her coronary CT was normal.  She did have a false positive exercise tolerance test.  We reviewed the strips from her ETT in the room today.  There is a period of time where her heart rate elevated and at that time ST changes were noted.  However coronary CT showed no coronary artery disease.  Echocardiogram also personally reviewed  and felt to be grossly normal.  Testing overall reassuring.  The patient denies chest pain, chest pressure, dyspnea at rest , PND, orthopnea, or leg swelling. Denies cough, fever, chills. Denies nausea, vomiting. Denies syncope or presyncope. Denies dizziness or lightheadedness.   Past Medical History: Past Medical History:  Diagnosis Date   Allergic state 06/03/2012   Anemia 06/03/2012   Breast lesion on mammography 06/03/2012   S/p biopsy on left with marker left behind, benign   Chicken pox as a child   Hyperlipidemia, mixed 08/11/2015   Hypothyroidism    Overweight 08/08/2014   PONV (postoperative nausea and vomiting)    Preventative health care 06/03/2012   Sun-damaged skin 06/03/2012   Thyroid nodule 06/03/2012   Unspecified hypothyroidism 06/03/2012    Past Surgical History: Past Surgical History:  Procedure Laterality Date   ABDOMINAL HYSTERECTOMY  07/13/2010   still has ovaries   BREAST BIOPSY  2011   BREAST SURGERY     left breast biopsies   ENDOMETRIAL ABLATION  2009   done in office- n&v postop   KNEE SURGERY     LAPAROSCOPIC ASSISTED VAGINAL HYSTERECTOMY  07/13/2010   Procedure: LAPAROSCOPIC ASSISTED VAGINAL HYSTERECTOMY;  Surgeon: Meriel Pica;  Location: [facility];  Service: Gynecology;  Laterality: N/A;   MOLE REMOVAL Right    forehead, 4th grade    Current Medications: Current Meds  Medication Sig   cetirizine (ZYRTEC) 10 MG tablet Take 10 mg by mouth daily.  levothyroxine (SYNTHROID) 100 MCG tablet TAKE 1 TABLET BY MOUTH EVERY DAY BEFORE BREAKFAST     Allergies:    Other   Social History: Social History   Socioeconomic History   Marital status: Married    Spouse name: Not on file   Number of children: 3   Years of education: Not on file   Highest education level: Not on file  Occupational History   Not on file  Tobacco Use   Smoking status: Never   Smokeless tobacco: Never  Vaping Use   Vaping Use: Never used  Substance and Sexual Activity    Alcohol use: Yes    Alcohol/week: 2.0 standard drinks of alcohol    Types: 2 Glasses of wine per week    Comment: occ   Drug use: No   Sexual activity: Yes  Other Topics Concern   Not on file  Social History Narrative   Not on file   Social Determinants of Health   Financial Resource Strain: Not on file  Food Insecurity: Not on file  Transportation Needs: Not on file  Physical Activity: Not on file  Stress: Not on file  Social Connections: Not on file     Family History: The patient's family history includes Asthma in her son; Cancer in her daughter, father, maternal grandmother, and paternal grandfather; Dementia in her father; Depression in her sister; Diabetes in her maternal grandmother; Emphysema in her mother; Heart attack in her mother; Hypertension in her father and maternal grandfather; Leukemia in her sister; Stroke in her maternal grandfather; Thyroid disease in her mother and sister. There is no history of Colon cancer, Rectal cancer, or Stomach cancer.  ROS:   All other ROS reviewed and negative. Pertinent positives noted in the HPI.     EKGs/Labs/Other Studies Reviewed:   The following studies were personally reviewed by me today:  EKG:  EKG is not ordered today  Recent Labs: 05/15/2022: BUN 14; Creatinine, Ser 0.67; Hemoglobin 14.5; Platelets 234; Potassium 4.6; Sodium 140; TSH 0.903   Recent Lipid Panel    Component Value Date/Time   CHOL 209 (H) 05/18/2022 1007   TRIG 53.0 05/18/2022 1007   HDL 65.00 05/18/2022 1007   CHOLHDL 3 05/18/2022 1007   VLDL 10.6 05/18/2022 1007   LDLCALC 133 (H) 05/18/2022 1007   LDLCALC 119 (H) 09/21/2019 1110    Physical Exam:   VS:  BP 124/72   Pulse 75   Ht 5\' 3"  (1.6 m)   Wt 154 lb 12.8 oz (70.2 kg)   LMP 07/05/2010   SpO2 100%   BMI 27.42 kg/m    Wt Readings from Last 3 Encounters:  07/02/22 154 lb 12.8 oz (70.2 kg)  05/23/22 153 lb 9.6 oz (69.7 kg)  05/17/22 151 lb 8 oz (68.7 kg)    Constitutional: No acute  distress Eyes: sclera non-icteric, normal conjunctiva and lids ENMT: normal dentition, moist mucous membranes Cardiovascular: regular rhythm, normal rate, no murmur. S1 and S2 normal. No jugular venous distention. Radial pulse 4/4, PT 4/4. Respiratory: clear to auscultation bilaterally GI : normal bowel sounds, soft and nontender. No distention.   MSK: extremities warm, well perfused. No edema.  NEURO: grossly nonfocal exam, moves all extremities. PSYCH: alert and oriented x 3, normal mood and affect.    ASSESSMENT:    1. SOB (shortness of breath) on exertion   2. Palpitations   3. PVC (premature ventricular contraction)   4. Precordial pain   5. Hypothyroidism, unspecified type  PLAN:   1. Palpitations 2. SOB (shortness of breath) on exertion 3. PVC (premature ventricular contraction) 4. Precordial pain -No significant recurrence of chest pain -No coronary artery disease and false positive exercise treadmill test. -Echocardiogram grossly normal -We discussed possible use of as needed metoprolol or diltiazem if she feels symptoms are returning.  She has had no presyncope or syncope since testing started.  Overall her cardiac monitor is reassuring and much of it demonstrates artifact.  She did have 3 beats of NSVT, if she has recurrent symptoms, we will consider starting AV nodal blockade.  Some nocturnal bradycardia noted on monitor as well, will do so cautiously. -With no coronary artery disease, LDL of 133 can be managed with diet intervention, recommend aggressive diet and lifestyle modification with a plant-based or Mediterranean diet, and she may proceed with an exercise regimen now that testing is reassuring.  Consider repeat lipids in 6 to 12 months to reassess.  Total time of encounter: 30 minutes total time of encounter, including 20 minutes spent in face-to-face patient care on the date of this encounter. This time includes coordination of care and counseling regarding  above mentioned problem list. Remainder of non-face-to-face time involved reviewing chart documents/testing relevant to the patient encounter and documentation in the medical record. I have independently reviewed documentation from referring provider.   Weston Brass, MD, [facility]  [facility]    Medication Adjustments/Labs and Tests Ordered: Current medicines are reviewed at length with the patient today.  Concerns regarding medicines are outlined above.  No orders of the defined types were placed in this encounter.  No orders of the defined types were placed in this encounter.   Patient Instructions     Follow-Up: At [facility], you and your health needs are our priority.  As part of our continuing mission to provide you with exceptional heart care, we have created designated Provider Care Teams.  These Care Teams include your primary Cardiologist (physician) and Advanced Practice Providers (APPs -  Physician Assistants and Nurse Practitioners) who all work together to provide you with the care you need, when you need it.  We recommend signing up for the patient portal called "MyChart".  Sign up information is provided on this After Visit Summary.  MyChart is used to connect with patients for Virtual Visits (Telemedicine).  Patients are able to view lab/test results, encounter notes, upcoming appointments, etc.  Non-urgent messages can be sent to your provider as well.   To learn more about what you can do with MyChart, go to [email or web address].    Your next appointment:   6 month(s)  Provider:   Weston Brass MD

## 2022-07-02 NOTE — Patient Instructions (Signed)
    Follow-Up: At [facility], you and your health needs are our priority.  As part of our continuing mission to provide you with exceptional heart care, we have created designated Provider Care Teams.  These Care Teams include your primary Cardiologist (physician) and Advanced Practice Providers (APPs -  Physician Assistants and Nurse Practitioners) who all work together to provide you with the care you need, when you need it.  We recommend signing up for the patient portal called "MyChart".  Sign up information is provided on this After Visit Summary.  MyChart is used to connect with patients for Virtual Visits (Telemedicine).  Patients are able to view lab/test results, encounter notes, upcoming appointments, etc.  Non-urgent messages can be sent to your provider as well.   To learn more about what you can do with MyChart, go to [email or web address].    Your next appointment:   6 month(s)  Provider:   Gayatri Acharya MD   

## 2022-07-08 ENCOUNTER — Other Ambulatory Visit: Payer: Self-pay | Admitting: Family Medicine

## 2022-07-18 ENCOUNTER — Telehealth: Payer: Self-pay | Admitting: Family Medicine

## 2022-07-18 NOTE — Telephone Encounter (Signed)
Prescription Request  07/18/2022  LOV: 05/17/2022  What is the name of the medication or equipment? levothyroxine (SYNTHROID) 100 MCG tablet   Pt states she is completely out of this medication Pt is scheduled for a CPE on 08/14/22  Have you contacted your pharmacy to request a refill? No   Which pharmacy would you like this sent to?   [facility] [phone] [address] [place] [address]   Patient notified that their request is being sent to the clinical staff for review and that they should receive a response within 2 business days.   Please advise at Mobile [phone] (mobile)

## 2022-07-19 ENCOUNTER — Other Ambulatory Visit (HOSPITAL_BASED_OUTPATIENT_CLINIC_OR_DEPARTMENT_OTHER): Payer: Self-pay

## 2022-07-19 MED ORDER — LEVOTHYROXINE SODIUM 100 MCG PO TABS
100.0000 ug | ORAL_TABLET | Freq: Every day | ORAL | 1 refills | Status: DC
  Filled 2022-07-19: qty 90, 90d supply, fill #0
  Filled 2022-10-12: qty 90, 90d supply, fill #1

## 2022-07-19 NOTE — Telephone Encounter (Signed)
Ok to refill 90 day supply with 1 refill

## 2022-07-19 NOTE — Telephone Encounter (Signed)
Rx done. 

## 2022-08-01 ENCOUNTER — Encounter (INDEPENDENT_AMBULATORY_CARE_PROVIDER_SITE_OTHER): Payer: Self-pay

## 2022-08-14 ENCOUNTER — Ambulatory Visit (INDEPENDENT_AMBULATORY_CARE_PROVIDER_SITE_OTHER): Payer: Commercial Managed Care - PPO | Admitting: Family Medicine

## 2022-08-14 ENCOUNTER — Encounter: Payer: Self-pay | Admitting: Family Medicine

## 2022-08-14 VITALS — BP 100/64 | HR 60 | Temp 98.2°F | Ht 61.0 in | Wt 155.3 lb

## 2022-08-14 DIAGNOSIS — Z0000 Encounter for general adult medical examination without abnormal findings: Secondary | ICD-10-CM

## 2022-08-14 DIAGNOSIS — Z23 Encounter for immunization: Secondary | ICD-10-CM | POA: Diagnosis not present

## 2022-08-14 DIAGNOSIS — Z1211 Encounter for screening for malignant neoplasm of colon: Secondary | ICD-10-CM | POA: Diagnosis not present

## 2022-08-14 DIAGNOSIS — Z13220 Encounter for screening for lipoid disorders: Secondary | ICD-10-CM | POA: Diagnosis not present

## 2022-08-14 DIAGNOSIS — Z1231 Encounter for screening mammogram for malignant neoplasm of breast: Secondary | ICD-10-CM

## 2022-08-14 NOTE — Patient Instructions (Signed)

## 2022-08-14 NOTE — Progress Notes (Unsigned)
Complete physical exam  Patient: Jordan Juarez   DOB: January 17, 1960   62 y.o. Female  MRN: [id number]  Subjective:    Chief Complaint  Patient presents with  . Annual Exam    Jordan Juarez is a 62 y.o. female who presents today for a complete physical exam. She reports consuming a general diet. Gym/ health club routine includes stationary bike and eliptical machine and rowing machine, exercises daily. She generally feels well. She reports sleeping well. She does not have additional problems to discuss today.    Most recent fall risk assessment:    03/11/2017    8:25 AM  Fall Risk   Falls in the past year? No     Most recent depression screenings:    08/14/2022    2:54 PM 04/22/2020    8:59 AM  PHQ 2/9 Scores  PHQ - 2 Score 0 0  PHQ- 9 Score  0    Vision:Within last year and Dental: No current dental problems and Receives regular dental care  Patient Active Problem List   Diagnosis Date Noted  . Heart palpitations 05/17/2022  . Right knee pain 05/18/2019  . Right foot pain 03/10/2019  . Hyperlipidemia, mixed 08/11/2015  . Overweight 08/08/2014  . Hypothyroidism 06/03/2012  . Allergic state 06/03/2012  . Preventative health care 06/03/2012  . Thyroid nodule 06/03/2012  . Sun-damaged skin 06/03/2012  . Anemia 06/03/2012  . Breast lesion on mammography 06/03/2012      Patient Care Team: Karie Georges, MD as PCP - General (Family Medicine)   Outpatient Medications Prior to Visit  Medication Sig  . cetirizine (ZYRTEC) 10 MG tablet Take 10 mg by mouth daily.  Marland Kitchen levothyroxine (SYNTHROID) 100 MCG tablet Take 1 tablet (100 mcg total) by mouth daily.  . [DISCONTINUED] levothyroxine (SYNTHROID) 100 MCG tablet TAKE 1 TABLET BY MOUTH EVERY DAY BEFORE BREAKFAST   No facility-administered medications prior to visit.    Review of Systems  HENT:  Negative for hearing loss.   Eyes:  Negative for blurred vision.  Respiratory:  Negative for shortness of breath.    Cardiovascular:  Negative for chest pain.  Gastrointestinal: Negative.   Genitourinary: Negative.   Musculoskeletal:  Negative for back pain.  Neurological:  Negative for headaches.  Psychiatric/Behavioral:  Negative for depression.   All other systems reviewed and are negative.      Objective:     BP 100/64 (BP Location: Left Arm, Patient Position: Sitting, Cuff Size: Normal)   Pulse 60   Temp 98.2 F (36.8 C) (Oral)   Ht 5\' 1"  (1.549 m)   Wt 155 lb 4.8 oz (70.4 kg)   LMP 07/05/2010   SpO2 98%   BMI 29.34 kg/m  {Vitals History (Optional):23777}  Physical Exam Vitals reviewed.  Constitutional:      Appearance: Normal appearance. She is well-groomed and normal weight.  HENT:     Right Ear: Tympanic membrane and ear canal normal.     Left Ear: Tympanic membrane and ear canal normal.     Mouth/Throat:     Mouth: Mucous membranes are moist.     Pharynx: No posterior oropharyngeal erythema.  Eyes:     Conjunctiva/sclera: Conjunctivae normal.  Neck:     Thyroid: No thyromegaly.  Cardiovascular:     Rate and Rhythm: Normal rate and regular rhythm.     Pulses: Normal pulses.     Heart sounds: S1 normal and S2 normal.  Pulmonary:     Effort: Pulmonary effort  is normal.     Breath sounds: Normal breath sounds and air entry.  Abdominal:     General: Abdomen is flat. Bowel sounds are normal.     Palpations: Abdomen is soft.  Musculoskeletal:     Right lower leg: No edema.     Left lower leg: No edema.  Lymphadenopathy:     Cervical: No cervical adenopathy.  Neurological:     Mental Status: She is alert and oriented to person, place, and time. Mental status is at baseline.     Gait: Gait is intact.  Psychiatric:        Mood and Affect: Mood and affect normal.        Speech: Speech normal.        Behavior: Behavior normal.        Judgment: Judgment normal.     No results found for any visits on 08/14/22. {Show previous labs (optional):23779}    Assessment &  Plan:    Routine Health Maintenance and Physical Exam  Immunization History  Administered Date(s) Administered  . IPV 10/15/2012  . Influenza Split 10/02/2011  . Influenza,inj,Quad PF,6+ Mos 10/26/2017, 08/26/2018, 12/23/2019  . Influenza-Unspecified 10/02/2014  . PFIZER(Purple Top)SARS-COV-2 Vaccination 04/11/2019, 05/02/2019, 12/23/2019  . Tdap 01/01/2010  . Zoster Recombinant(Shingrix) 03/10/2019, 10/17/2019    Health Maintenance  Topic Date Due  . DTaP/Tdap/Td (2 - Td or Tdap) 01/02/2020  . MAMMOGRAM  04/07/2021  . Colonoscopy  08/07/2022  . INFLUENZA VACCINE  08/02/2022  . COVID-19 Vaccine (4 - 2023-24 season) 08/30/2022 (Originally 09/01/2021)  . Zoster Vaccines- Shingrix  Completed  . HPV VACCINES  Aged Out  . PAP SMEAR-Modifier  Discontinued  . Hepatitis C Screening  Discontinued  . HIV Screening  Discontinued    Discussed health benefits of physical activity, and encouraged her to engage in regular exercise appropriate for her age and condition.  Breast cancer screening by mammogram -     Digital Screening Mammogram, Left and Right; Future  Routine general medical examination at a health care facility  Colon cancer screening -     Ambulatory referral to Gastroenterology  Immunization due -     Tdap vaccine greater than or equal to 7yo IM  Normal physical exam findings today, handouts given on healthy eating and exercise. Counseled patient on vaccinations today including upcoming flu, covid boosters. Tdap ordered for today.   Return in 1 year (on 08/14/2023).     Karie Georges, MD

## 2022-08-27 ENCOUNTER — Ambulatory Visit: Payer: Commercial Managed Care - PPO

## 2022-09-12 ENCOUNTER — Ambulatory Visit
Admission: RE | Admit: 2022-09-12 | Discharge: 2022-09-12 | Disposition: A | Discharge status: Home / Self Care | Payer: Commercial Managed Care - PPO | Source: Ambulatory Visit | Attending: Family Medicine | Admitting: Family Medicine

## 2022-09-12 ENCOUNTER — Ambulatory Visit: Payer: Commercial Managed Care - PPO

## 2022-09-12 DIAGNOSIS — Z1231 Encounter for screening mammogram for malignant neoplasm of breast: Secondary | ICD-10-CM | POA: Diagnosis not present

## 2022-11-01 ENCOUNTER — Ambulatory Visit (AMBULATORY_SURGERY_CENTER): Payer: Commercial Managed Care - PPO

## 2022-11-01 ENCOUNTER — Other Ambulatory Visit (HOSPITAL_BASED_OUTPATIENT_CLINIC_OR_DEPARTMENT_OTHER): Payer: Self-pay

## 2022-11-01 ENCOUNTER — Encounter: Payer: Self-pay | Admitting: Internal Medicine

## 2022-11-01 VITALS — Ht 62.0 in | Wt 150.0 lb

## 2022-11-01 DIAGNOSIS — Z1211 Encounter for screening for malignant neoplasm of colon: Secondary | ICD-10-CM

## 2022-11-01 MED ORDER — NA SULFATE-K SULFATE-MG SULF 17.5-3.13-1.6 GM/177ML PO SOLN
1.0000 | Freq: Once | ORAL | 0 refills | Status: AC
Start: 2022-11-01 — End: 2022-11-06
  Filled 2022-11-01: qty 354, 2d supply, fill #0

## 2022-11-01 NOTE — Progress Notes (Signed)
No egg or soy allergy known to patient  No issues known to pt with past sedation with any surgeries or procedures Patient denies ever being told they had issues or difficulty with intubation  No FH of Malignant Hyperthermia Pt is not on diet pills Pt is not on  home 02  Pt is not on blood thinners  Pt denies issues with constipation  No A fib or A flutter. Reports hx of palpations with negative work up  Have any cardiac testing pending--no  LOA: independent  Prep: Suprep   Patient's chart reviewed by Cathlyn Parsons CNRA prior to previsit and patient appropriate for the [facility].  Previsit completed and red dot placed by patient's name on their procedure day (on provider's schedule).     PV competed with patient. Prep instructions sent via mychart and home address.

## 2022-11-19 ENCOUNTER — Ambulatory Visit: Payer: Commercial Managed Care - PPO | Admitting: Family Medicine

## 2022-11-22 ENCOUNTER — Encounter: Payer: Self-pay | Admitting: Internal Medicine

## 2022-11-22 ENCOUNTER — Ambulatory Visit: Payer: Commercial Managed Care - PPO | Admitting: Internal Medicine

## 2022-11-22 VITALS — BP 109/70 | HR 65 | Temp 97.7°F | Resp 13 | Ht 61.0 in | Wt 150.0 lb

## 2022-11-22 DIAGNOSIS — Z1211 Encounter for screening for malignant neoplasm of colon: Secondary | ICD-10-CM

## 2022-11-22 DIAGNOSIS — E039 Hypothyroidism, unspecified: Secondary | ICD-10-CM | POA: Diagnosis not present

## 2022-11-22 MED ORDER — SODIUM CHLORIDE 0.9 % IV SOLN
500.0000 mL | Freq: Once | INTRAVENOUS | Status: DC
Start: 2022-11-22 — End: 2022-11-22

## 2022-11-22 NOTE — Patient Instructions (Addendum)
Recommendation:           - Patient has a contact number available for                            emergencies. The signs and symptoms of potential                            delayed complications were discussed with the                            patient. Return to normal activities tomorrow.                            Written discharge instructions were provided to the                            patient.                           - Resume previous diet.                           - Continue present medications.                           - Repeat colonoscopy in 10 years for screening                            purposes.  Handouts on diverticulosis and hemorrhoids given.  YOU HAD AN ENDOSCOPIC PROCEDURE TODAY AT THE [facility]:   Refer to the procedure report that was given to you for any specific questions about what was found during the examination.  If the procedure report does not answer your questions, please call your gastroenterologist to clarify.  If you requested that your care partner not be given the details of your procedure findings, then the procedure report has been included in a sealed envelope for you to review at your convenience later.  YOU SHOULD EXPECT: Some feelings of bloating in the abdomen. Passage of more gas than usual.  Walking can help get rid of the air that was put into your GI tract during the procedure and reduce the bloating. If you had a lower endoscopy (such as a colonoscopy or flexible sigmoidoscopy) you may notice spotting of blood in your stool or on the toilet paper. If you underwent a bowel prep for your procedure, you may not have a normal bowel movement for a few days.  Please Note:  You might notice some irritation and congestion in your nose or some drainage.  This is from the oxygen used during your procedure.  There is no need for concern and it should clear up in a day or so.  SYMPTOMS TO REPORT IMMEDIATELY:  Following lower endoscopy  (colonoscopy or flexible sigmoidoscopy):  Excessive amounts of blood in the stool  Significant tenderness or worsening of abdominal pains  Swelling of the abdomen that is new, acute  Fever of 100F or higher  For urgent or emergent issues, a gastroenterologist can be reached at any hour by calling [phone]. Do not use MyChart messaging for urgent concerns.  DIET:  We do recommend a small meal at first, but then you may proceed to your regular diet.  Drink plenty of fluids but you should avoid alcoholic beverages for 24 hours.  ACTIVITY:  You should plan to take it easy for the rest of today and you should NOT DRIVE or use heavy machinery until tomorrow (because of the sedation medicines used during the test).    FOLLOW UP: Our staff will call the number listed on your records the next business day following your procedure.  We will call around 7:15- 8:00 am to check on you and address any questions or concerns that you may have regarding the information given to you following your procedure. If we do not reach you, we will leave a message.     If any biopsies were taken you will be contacted by phone or by letter within the next 1-3 weeks.  Please call us at [phone] if you have not heard about the biopsies in 3 weeks.    SIGNATURES/CONFIDENTIALITY: You and/or your care partner have signed paperwork which will be entered into your electronic medical record.  These signatures attest to the fact that that the information above on your After Visit Summary has been reviewed and is understood.  Full responsibility of the confidentiality of this discharge information lies with you and/or your care-partner.

## 2022-11-22 NOTE — Op Note (Signed)
[facility] Patient Name: Jordan Juarez Procedure Date: 11/22/2022 9:18 AM MRN: [id number] Endoscopist: Beverley Fiedler , MD, [id number] Age: 62 Referring MD:  Date of Birth: 03/12/1960 Gender: Female Account #: [id number] Procedure:                Colonoscopy Indications:              Screening for colorectal malignant neoplasm, Last                            colonoscopy 10 years ago Medicines:                Monitored Anesthesia Care Procedure:                Pre-Anesthesia Assessment:                           - Prior to the procedure, a History and Physical                            was performed, and patient medications and                            allergies were reviewed. The patient's tolerance of                            previous anesthesia was also reviewed. The risks                            and benefits of the procedure and the sedation                            options and risks were discussed with the patient.                            All questions were answered, and informed consent                            was obtained. Prior Anticoagulants: The patient has                            taken no anticoagulant or antiplatelet agents. ASA                            Grade Assessment: II - A patient with mild systemic                            disease. After reviewing the risks and benefits,                            the patient was deemed in satisfactory condition to                            undergo the procedure.  After obtaining informed consent, the colonoscope                            was passed under direct vision. Throughout the                            procedure, the patient's blood pressure, pulse, and                            oxygen saturations were monitored continuously. The                            Olympus Scope SN [id number] was introduced through the                            anus and advanced to the  terminal ileum. The                            colonoscopy was performed without difficulty. The                            patient tolerated the procedure well. The quality                            of the bowel preparation was good. The terminal                            ileum, ileocecal valve, appendiceal orifice, and                            rectum were photographed. Scope In: 9:33:27 AM Scope Out: 9:46:43 AM Scope Withdrawal Time: 0 hours 10 minutes 31 seconds  Total Procedure Duration: 0 hours 13 minutes 16 seconds  Findings:                 Skin tags were found on perianal exam.                           The terminal ileum appeared normal.                           A few small-mouthed diverticula were found in the                            sigmoid colon.                           Internal hemorrhoids were found during                            retroflexion. The hemorrhoids were small.                           The exam was otherwise without abnormality. Complications:            No immediate complications. Estimated  Blood Loss:     Estimated blood loss: none. Impression:               - The examined portion of the ileum was normal.                           - Mild diverticulosis in the sigmoid colon.                           - Small internal hemorrhoids and anal skin tag.                           - The examination was otherwise normal.                           - No specimens collected. Recommendation:           - Patient has a contact number available for                            emergencies. The signs and symptoms of potential                            delayed complications were discussed with the                            patient. Return to normal activities tomorrow.                            Written discharge instructions were provided to the                            patient.                           - Resume previous diet.                           -  Continue present medications.                           - Repeat colonoscopy in 10 years for screening                            purposes. Beverley Fiedler, MD 11/22/2022 9:48:59 AM This report has been signed electronically.

## 2022-11-22 NOTE — Progress Notes (Signed)
Pt's states no medical or surgical changes since previsit or office visit. 

## 2022-11-22 NOTE — Progress Notes (Signed)
GASTROENTEROLOGY PROCEDURE H&P NOTE   Primary Care Physician: Karie Georges, MD    Reason for Procedure:  Colon cancer screening  Plan:    Colonoscopy  Patient is appropriate for endoscopic procedure(s) in the ambulatory ([facility]) setting.  The nature of the procedure, as well as the risks, benefits, and alternatives were carefully and thoroughly reviewed with the patient. Ample time for discussion and questions allowed. The patient understood, was satisfied, and agreed to proceed.     HPI: Jordan Juarez is a 62 y.o. female who presents for screening colonoscopy.  Medical history as below.  Tolerated the prep.  No recent chest pain or shortness of breath.  No abdominal pain today.  Last colonoscopy August 2014.  Past Medical History:  Diagnosis Date   Allergic state 06/03/2012   Anemia 06/03/2012   Breast lesion on mammography 06/03/2012   S/p biopsy on left with marker left behind, benign   Chicken pox as a child   Hyperlipidemia, mixed 08/11/2015   Hypothyroidism    Overweight 08/08/2014   PONV (postoperative nausea and vomiting)    Preventative health care 06/03/2012   Sun-damaged skin 06/03/2012   Thyroid nodule 06/03/2012   Unspecified hypothyroidism 06/03/2012    Past Surgical History:  Procedure Laterality Date   ABDOMINAL HYSTERECTOMY  07/13/2010   still has ovaries   BREAST BIOPSY  2011   BREAST SURGERY     left breast biopsies   ENDOMETRIAL ABLATION  2009   done in office- n&v postop   KNEE SURGERY     LAPAROSCOPIC ASSISTED VAGINAL HYSTERECTOMY  07/13/2010   Procedure: LAPAROSCOPIC ASSISTED VAGINAL HYSTERECTOMY;  Surgeon: Meriel Pica;  Location: [facility];  Service: Gynecology;  Laterality: N/A;   MOLE REMOVAL Right    forehead, 4th grade    Prior to Admission medications   Medication Sig Start Date End Date Taking? Authorizing Provider  cetirizine (ZYRTEC) 10 MG tablet Take 10 mg by mouth daily as needed.   Yes [provider]  levothyroxine  (SYNTHROID) 100 MCG tablet Take 1 tablet (100 mcg total) by mouth daily. 07/19/22  Yes Karie Georges, MD    Current Outpatient Medications  Medication Sig Dispense Refill   cetirizine (ZYRTEC) 10 MG tablet Take 10 mg by mouth daily as needed.     levothyroxine (SYNTHROID) 100 MCG tablet Take 1 tablet (100 mcg total) by mouth daily. 90 tablet 1   Current Facility-Administered Medications  Medication Dose Route Frequency Provider Last Rate Last Admin   0.9 %  sodium chloride infusion  500 mL Intravenous Once Diyari Cherne, Carie Caddy, MD        Allergies as of 11/22/2022 - Review Complete 11/22/2022  Allergen Reaction Noted   Other  05/11/2019    Family History  Problem Relation Age of Onset   Heart attack Mother    Emphysema Mother        smoker   Thyroid disease Mother    Hypertension Father    Cancer Father        prostate cancer   Dementia Father    Leukemia Sister        acute/ smoker   Thyroid disease Sister    Depression Sister        serious MVA, chronic pain   Cancer Maternal Grandmother        lung/ smoker   Diabetes Maternal Grandmother        type 2   Stroke Maternal Grandfather  several   Hypertension Maternal Grandfather    Cancer Paternal Grandfather        bone/ smoker   Cancer Daughter        2 abnormal skin lesion excisions at age 58 first time   Asthma Son    Colon cancer Neg Hx    Rectal cancer Neg Hx    Stomach cancer Neg Hx    Esophageal cancer Neg Hx     Social History   Socioeconomic History   Marital status: Married    Spouse name: Not on file   Number of children: 3   Years of education: Not on file   Highest education level: Not on file  Occupational History   Not on file  Tobacco Use   Smoking status: Never   Smokeless tobacco: Never  Vaping Use   Vaping status: Never Used  Substance and Sexual Activity   Alcohol use: Yes    Alcohol/week: 2.0 standard drinks of alcohol    Types: 2 Glasses of wine per week    Comment: occ    Drug use: No   Sexual activity: Yes  Other Topics Concern   Not on file  Social History Narrative   Not on file   Social Determinants of Health   Financial Resource Strain: Not on file  Food Insecurity: Not on file  Transportation Needs: Not on file  Physical Activity: Not on file  Stress: Not on file  Social Connections: Not on file  Intimate Partner Violence: Not on file    Physical Exam: Vital signs in last 24 hours: @BP  114/74   Pulse 74   Temp 97.7 F (36.5 C) (Temporal)   Ht 5\' 1"  (1.549 m)   Wt 150 lb (68 kg)   LMP 07/05/2010   SpO2 96%   BMI 28.34 kg/m  GEN: NAD EYE: Sclerae anicteric ENT: MMM CV: Non-tachycardic Pulm: CTA b/l GI: Soft, NT/ND NEURO:  Alert & Oriented x 3   Erick Blinks, MD [facility]  11/22/2022 9:17 AM

## 2022-11-22 NOTE — Progress Notes (Signed)
To pacu, VSS. Report to [email or web address] 

## 2022-11-23 ENCOUNTER — Telehealth: Payer: Self-pay | Admitting: *Deleted

## 2022-11-23 NOTE — Telephone Encounter (Signed)
  Follow up Call-     11/22/2022    8:41 AM  Call back number  Post procedure Call Back phone  # [phone]  Permission to leave phone message Yes     Patient questions:  Do you have a fever, pain , or abdominal swelling? No. Pain Score  0 *  Have you tolerated food without any problems? Yes.    Have you been able to return to your normal activities? Yes.    Do you have any questions about your discharge instructions: Diet   No. Medications  No. Follow up visit  No.  Do you have questions or concerns about your Care? No.  Actions: * If pain score is 4 or above: No action needed, pain <4.

## 2023-01-08 ENCOUNTER — Other Ambulatory Visit: Payer: Self-pay | Admitting: Family Medicine

## 2023-01-09 ENCOUNTER — Other Ambulatory Visit (HOSPITAL_BASED_OUTPATIENT_CLINIC_OR_DEPARTMENT_OTHER): Payer: Self-pay

## 2023-01-09 MED ORDER — LEVOTHYROXINE SODIUM 100 MCG PO TABS
100.0000 ug | ORAL_TABLET | Freq: Every day | ORAL | 1 refills | Status: DC
  Filled 2023-01-09: qty 90, 90d supply, fill #0
  Filled 2023-04-24: qty 90, 90d supply, fill #1

## 2023-07-29 ENCOUNTER — Other Ambulatory Visit (HOSPITAL_BASED_OUTPATIENT_CLINIC_OR_DEPARTMENT_OTHER): Payer: Self-pay

## 2023-07-29 ENCOUNTER — Other Ambulatory Visit: Payer: Self-pay | Admitting: Family Medicine

## 2023-07-29 MED ORDER — LEVOTHYROXINE SODIUM 100 MCG PO TABS
100.0000 ug | ORAL_TABLET | Freq: Every day | ORAL | 1 refills | Status: DC
  Filled 2023-07-29: qty 90, 90d supply, fill #0

## 2023-10-03 ENCOUNTER — Telehealth: Payer: Self-pay | Admitting: Internal Medicine

## 2023-10-03 NOTE — Telephone Encounter (Signed)
 Pt's spouse Wolm is requesting a callback from directly from MD regarding pt's symptoms progressing and he'd like to discuss what how to move forward. Please advise

## 2023-10-03 NOTE — Telephone Encounter (Signed)
 Dr. Loni has spoken with the patient's husband. Patient has been scheduled for an appointment tomorrow 10/3

## 2023-10-04 ENCOUNTER — Ambulatory Visit (INDEPENDENT_AMBULATORY_CARE_PROVIDER_SITE_OTHER): Payer: No Typology Code available for payment source | Admitting: Internal Medicine

## 2023-10-04 ENCOUNTER — Encounter: Payer: Self-pay | Admitting: Internal Medicine

## 2023-10-04 ENCOUNTER — Other Ambulatory Visit (HOSPITAL_COMMUNITY): Payer: Self-pay

## 2023-10-04 ENCOUNTER — Ambulatory Visit
Admission: RE | Admit: 2023-10-04 | Discharge: 2023-10-04 | Disposition: A | Discharge status: Home / Self Care | Source: Ambulatory Visit | Attending: Internal Medicine | Admitting: Internal Medicine

## 2023-10-04 ENCOUNTER — Ambulatory Visit: Payer: Self-pay | Admitting: Internal Medicine

## 2023-10-04 VITALS — BP 126/62 | HR 65 | Ht 62.0 in | Wt 152.8 lb

## 2023-10-04 DIAGNOSIS — R002 Palpitations: Secondary | ICD-10-CM

## 2023-10-04 DIAGNOSIS — R0683 Snoring: Secondary | ICD-10-CM

## 2023-10-04 DIAGNOSIS — R0609 Other forms of dyspnea: Secondary | ICD-10-CM

## 2023-10-04 DIAGNOSIS — I493 Ventricular premature depolarization: Secondary | ICD-10-CM | POA: Insufficient documentation

## 2023-10-04 DIAGNOSIS — R0602 Shortness of breath: Secondary | ICD-10-CM | POA: Diagnosis not present

## 2023-10-04 LAB — ECHOCARDIOGRAM COMPLETE
Area-P 1/2: 3.33 cm2
Height: 62 in
S' Lateral: 2.6 cm
Weight: 2444.8 [oz_av]

## 2023-10-04 MED ORDER — METOPROLOL TARTRATE 25 MG PO TABS
12.5000 mg | ORAL_TABLET | ORAL | 0 refills | Status: DC | PRN
  Filled 2023-10-04: qty 15, 30d supply, fill #0

## 2023-10-04 NOTE — Patient Instructions (Addendum)
 Medication Instructions:  Your physician recommends that you continue on your current medications as directed. Please refer to the Current Medication list given to you today.   Start METOPROLOL SUCCINATE 12.5 AS NEEDED FOR palpitations *If you need a refill on your cardiac medications before your next appointment, please call your pharmacy*  Lab Work: NONE If you have labs (blood work) drawn today and your tests are completely normal, you will receive your results only by: MyChart Message (if you have MyChart) OR A paper copy in the mail If you have any lab test that is abnormal or we need to change your treatment, we will call you to review the results.  Testing/Procedures: ECHO  Your physician has requested that you have an echocardiogram. Echocardiography is a painless test that uses sound waves to create images of your heart. It provides your doctor with information about the size and shape of your heart and how well your heart's chambers and valves are working. This procedure takes approximately one hour. There are no restrictions for this procedure. Please do NOT wear cologne, perfume, aftershave, or lotions (deodorant is allowed). Please arrive 15 minutes prior to your appointment time.  Please note: We ask at that you not bring children with you during ultrasound (echo/ vascular) testing. Due to room size and safety concerns, children are not allowed in the ultrasound rooms during exams. Our front office staff cannot provide observation of children in our lobby area while testing is being conducted. An adult accompanying a patient to their appointment will only be allowed in the ultrasound room at the discretion of the ultrasound technician under special circumstances. We apologize for any inconvenience.    And  ZIO ZIO XT- Long Term Monitor Instructions  Your physician has requested you wear a ZIO patch monitor for 3  days.  This is a single patch monitor. [employer] supplies one  patch monitor per enrollment. Additional stickers are not available. Please do not apply patch if you will be having a Nuclear Stress Test,  Echocardiogram, Cardiac CT, MRI, or Chest Xray during the period you would be wearing the  monitor. The patch cannot be worn during these tests. You cannot remove and re-apply the  ZIO XT patch monitor.  Your ZIO patch monitor will be mailed 3 day USPS to your address on file. It may take 3-5 days  to receive your monitor after you have been enrolled.  Once you have received your monitor, please review the enclosed instructions. Your monitor  has already been registered assigning a specific monitor serial # to you.  Billing and Patient Assistance Program Information  We have supplied [employer] with any of your insurance information on file for billing purposes. [employer] offers a sliding scale Patient Assistance Program for patients that do not have  insurance, or whose insurance does not completely cover the cost of the ZIO monitor.  You must apply for the Patient Assistance Program to qualify for this discounted rate.  To apply, please call [employer] at [phone], select option 4, select option 2, ask to apply for  Patient Assistance Program. Meredeth will ask your household income, and how many people  are in your household. They will quote your out-of-pocket cost based on that information.  [employer] will also be able to set up a 65-month, interest-free payment plan if needed.  Applying the monitor   Shave hair from upper left chest.  Hold abrader disc by orange tab. Rub abrader in 40 strokes over the upper left chest  as  indicated in your monitor instructions.  Clean area with 4 enclosed alcohol pads. Let dry.  Apply patch as indicated in monitor instructions. Patch will be placed under collarbone on left  side of chest with arrow pointing upward.  Rub patch adhesive wings for 2 minutes. Remove white label marked 1. Remove the white  label marked  2. Rub patch adhesive wings for 2 additional minutes.  While looking in a mirror, press and release button in center of patch. A small green light will  flash 3-4 times. This will be your only indicator that the monitor has been turned on.  Do not shower for the first 24 hours. You may shower after the first 24 hours.  Press the button if you feel a symptom. You will hear a small click. Record Date, Time and  Symptom in the Patient Logbook.  When you are ready to remove the patch, follow instructions on the last 2 pages of Patient  Logbook. Stick patch monitor onto the last page of Patient Logbook.  Place Patient Logbook in the blue and white box. Use locking tab on box and tape box closed  securely. The blue and white box has prepaid postage on it. Please place it in the mailbox as  soon as possible. Your physician should have your test results approximately 7 days after the  monitor has been mailed back to [facility].  Call [facility] Customer Care at [phone] if you have questions regarding  your ZIO XT patch monitor. Call them immediately if you see an orange light blinking on your  monitor.  If your monitor falls off in less than 4 days, contact our Monitor department at [phone].  If your monitor becomes loose or falls off after 4 days call [employer] at [phone] for  suggestions on securing your monitor   Follow-Up: At [facility], you and your health needs are our priority.  As part of our continuing mission to provide you with exceptional heart care, our providers are all part of one team.  This team includes your primary Cardiologist (physician) and Advanced Practice Providers or APPs (Physician Assistants and Nurse Practitioners) who all work together to provide you with the care you need, when you need it.  Your next appointment:   Already scheduled  Provider:   Dana Dunn @10 /15  We recommend signing up for the patient portal called MyChart.   Sign up information is provided on this After Visit Summary.  MyChart is used to connect with patients for Virtual Visits (Telemedicine).  Patients are able to view lab/test results, encounter notes, upcoming appointments, etc.  Non-urgent messages can be sent to your provider as well.   To learn more about what you can do with MyChart, go to [email or web address].   Other Instructions Referral to [facility]

## 2023-10-04 NOTE — Progress Notes (Signed)
 Cardiology Office Note:  .   Date:  10/04/2023  ID:  Jordan Juarez, DOB 10/07/1960, MRN [id number] PCP: Ozell Heron HERO, MD  [facility] Providers Cardiologist:  None    History of Present Illness: .   Jordan Juarez is a 63 y.o. female.  Discussed the use of AI scribe software for clinical note transcription with the patient, who gave verbal consent to proceed.  History of Present Illness Jordan Juarez is a 63 year old female who presents with symptomatic palpitations and frequent PVCs.  Since July, she experiences frequent PVCs with lightheadedness and weakness. Her pulse sometimes reads thirty beats per minute, but this is likely nonperfused beats as auscultation by her husband is normal rate. These symptoms affect her ability to exercise, causing fatigue and dyspnea during physical activity.  A heart monitor last year did not show significant PVCs, but her Apple Watch now confirms her presence. PVCs occur both at rest and during activity, with no correlation to physical exertion. I have independently reviewed the patient's watch recordings, confirming likely PVCs.  She has reduced caffeine intake to half a cup of coffee in the morning without symptom improvement and maintains good hydration. Her blood pressure is higher than usual, although typically low. She has not monitored it at home recently. No significant ankle or leg swelling is observed, though she notes marks from socks and leggings.  Workup last year for palpitations and chest discomfort revealed normal coronary arteries and normal echo.  ROS: negative except per HPI above.  Studies Reviewed: SABRA   EKG Interpretation Date/Time:  Friday October 04 2023 11:22:02 EDT Ventricular Rate:  65 PR Interval:  158 QRS Duration:  82 QT Interval:  412 QTC Calculation: 428 R Axis:   91  Text Interpretation: Normal sinus rhythm Rightward axis When compared with ECG of 15-May-2022 10:18, No significant change was  found Confirmed by Loni Rushing ([id number]) on 10/04/2023 11:27:09 AM    Results DIAGNOSTIC Echocardiogram: Normal (2024) Exercise treadmill: No arrhythmias (2024), false positive Cardiac monitor: No premature ventricular contractions (PVCs) with stress, one short 7-beat episode of non-sustained ventricular tachycardia (2024) Risk Assessment/Calculations:       Physical Exam:   VS:  BP 126/62   Pulse 65   Ht 5' 2 (1.575 m)   Wt 152 lb 12.8 oz (69.3 kg)   LMP 07/05/2010   SpO2 99%   BMI 27.95 kg/m    Wt Readings from Last 3 Encounters:  10/04/23 152 lb 12.8 oz (69.3 kg)  11/22/22 150 lb (68 kg)  11/01/22 150 lb (68 kg)     Physical Exam GENERAL: Alert, cooperative, well developed, no acute distress HEENT: Normocephalic, normal oropharynx, moist mucous membranes CHEST: Clear to auscultation bilaterally, no wheezes, rhonchi, or crackles CARDIOVASCULAR: Normal heart rate and rhythm, S1 and S2 normal without murmurs. Palpation of pulse demonstrates nonperfused beats.  ABDOMEN: Soft, non-tender, non-distended, without organomegaly, normal bowel sounds EXTREMITIES: No cyanosis or edema NEUROLOGICAL: Cranial nerves grossly intact, moves all extremities without gross motor or sensory deficit   ASSESSMENT AND PLAN: .    Assessment and Plan Assessment & Plan Symptomatic premature ventricular contractions (PVCs) with palpitations and exertional shortness of breath Symptomatic PVCs with palpitations and exertional shortness of breath confirmed by Apple Watch recordings. No heart failure or coronary artery disease. Discussed potential flecainide use if echocardiogram is normal and PVC burden is significant. Emphasized caffeine cessation for accurate assessment. - Order three-day cardiac monitor to quantify PVC burden. - Schedule echocardiogram today  to reassess cardiac function. - Prescribe metoprolol 12.5 mg as needed for symptomatic relief. - Advise complete cessation of caffeine for  one month. - Consider flecainide if echocardiogram is normal and PVC burden is significant. - Discuss potential electrophysiology referral if PVC burden is high or further intervention needed.  Suspected obstructive sleep apnea Suspected obstructive sleep apnea due to snoring and potential nocturnal hypoxia. Home sleep test appropriate. - Order home sleep test to evaluate for obstructive sleep apnea.        Soyla Merck, MD, FACC

## 2023-10-07 ENCOUNTER — Ambulatory Visit: Attending: Internal Medicine

## 2023-10-07 DIAGNOSIS — R002 Palpitations: Secondary | ICD-10-CM

## 2023-10-07 DIAGNOSIS — I493 Ventricular premature depolarization: Secondary | ICD-10-CM

## 2023-10-07 NOTE — Progress Notes (Unsigned)
 Enrolled for [employer] to mail a ZIO XT long term holter monitor to the patients address on file.

## 2023-10-15 NOTE — Progress Notes (Unsigned)
 Cardiology Office Note    Date:  10/16/2023  ID:  Jordan Juarez, DOB [birth year], MRN [id number] PCP:  Ozell Heron HERO, MD  Cardiologist:  Soyla DELENA Merck, MD  Electrophysiologist:  None   Chief Complaint: f/u PVCs  History of Present Illness: .    Jordan Juarez is a 63 y.o. female with visit-pertinent history of PVCs, PACs, PSVT, NSVT, suspected sleep apnea, HLD managed by PCP, hypothyroidism, anemia seen for follow-up. Her husband is Dr. Wolm Scarlet. Cor CT 05/2022 showed no evidence of CAD. ETT 06/2022 showed 2mm horizontal ST depression during exercise - given recent coronary CTA, suspected false positive, findings felt possibly c/w microvascular dysfunction. Monitor 07/2022 showed NSR, range 42-190, avg 72bpm, brief PSVT (8 episodes longest 5.1 sec), one 7 beat NSVT, rare PACs/PVCs.   She was recently seen by Dr. Acharya for symptomatic palpitations with pulse sometimes reading in the 30s with Apple Watch demonstrating PVCs. Monitor was ordered to assess PVC burden which is in process. Echo 10/04/23 showed EF 60-65%, G1DD, mild LAE, trivial MR, dilated IVC. Sleep study also pending. She was started on Toprol 12.5mg  daily PRN palpitations. She returns for follow-up today. Monitor has not yet resulted, recently mailed back - did want to note that the findings reflect not having been on beta blocker, as she wanted to capture the true burden before starting. She started the low dose metoprolol 12.5mg  up to BID after completion of the monitor, first dose several days ago. There might have been some improvement but it did not fully resolve the palpitations. She definitely felt multiple palpitations during the monitoring period described as occasional skips but also some brief tachypalpitations. This is sometimes associated with a feeling of chest tightness, needing to sigh or take a deep breath. No overt chest pain. No syncope. Smart tracings reviewed today primarily showing frequent PVCs.  Cutting down caffeine has not made a difference. Of note, blood pressure tends to chronically run on the lower end.  Labwork independently reviewed: 08/2022 LDL 133, trig ok 05/2022 TSH, H/H/Plt, BMET ok K 4.6, Cr 0.67 2022 LFTs ok  ROS: .    Please see the history of present illness. All other systems are reviewed and otherwise negative.  Studies Reviewed: SABRA    EKG:  EKG not ordered today, reviewed from last OV 10/04/23 NSR without acute STT changes  CV Studies: Cardiac studies reviewed are outlined and summarized above. Otherwise please see EMR for full report.   Current Reported Medications:.    Current Meds  Medication Sig   cetirizine (ZYRTEC) 10 MG tablet Take 10 mg by mouth daily as needed.   levothyroxine  (SYNTHROID ) 100 MCG tablet Take 1 tablet (100 mcg total) by mouth daily.   metoprolol tartrate (LOPRESSOR) 25 MG tablet Take 0.5 tablets (12.5 mg total) by mouth as needed. For palpitations    Physical Exam:    VS:  BP 102/62   Pulse 60   Ht 5' 3 (1.6 m)   Wt 152 lb (68.9 kg)   LMP 07/05/2010   BMI 26.93 kg/m    Wt Readings from Last 3 Encounters:  10/16/23 152 lb (68.9 kg)  10/04/23 152 lb 12.8 oz (69.3 kg)  11/22/22 150 lb (68 kg)    GEN: Well nourished, well developed in no acute distress NECK: No JVD; No carotid bruits CARDIAC: RRR, no murmurs, rubs, gallops RESPIRATORY:  Clear to auscultation without rales, wheezing or rhonchi  ABDOMEN: Soft, non-tender, non-distended EXTREMITIES:  No edema; No acute deformity  Asessement and Plan:.    1. Palpitations, frequent PVCs - presentation most c/w symptomatic PVCs captured on smart tracings. The event monitor is still pending to assess the burden. The result will come back to ordering provider Dr. Loni. If she has a high burden, would consider cardiac MRI since echo generally unrevealing; she reports Dr. DELENA also mentioned this to her as well. Will obtain BMET, CBC, Mg, TSH, free T4 today as well. We discussed  trial of consolidating her Lopressor to Toprol 25mg  daily versus awaiting monitor first. She prefers to continue the Lopressor for now and await monitor results - advised she take 12.5mg  BID scheduled for now to see how she does. Need to be cognizant that she does not have much blood pressure room to advance medication. May end up needing antiarrhythmic if she is unable to get relief with beta blocker. Per shared decision making will initiate referral to EP as this may take some time to get into. Will plan for 6 week gen card f/u in the meantime.  2. Previous brief PSVT/NSVT - follow-up monitor pending.  3. Suspected sleep apnea - sleep study pending.  4. Hypothyroidism - check TSH with free T4 today.     Disposition: Refer to EP as above. Will also arrange gen cards f/u 6 weeks, me or Dr. Loni.  Signed, Baer Hinton N Rochelle Larue, PA-C

## 2023-10-16 ENCOUNTER — Encounter: Payer: Self-pay | Admitting: Physician Assistant

## 2023-10-16 ENCOUNTER — Other Ambulatory Visit: Payer: Self-pay

## 2023-10-16 ENCOUNTER — Ambulatory Visit: Attending: Cardiovascular Disease | Admitting: Physician Assistant

## 2023-10-16 VITALS — BP 102/62 | HR 60 | Ht 63.0 in | Wt 152.0 lb

## 2023-10-16 DIAGNOSIS — I471 Supraventricular tachycardia, unspecified: Secondary | ICD-10-CM | POA: Diagnosis not present

## 2023-10-16 DIAGNOSIS — E039 Hypothyroidism, unspecified: Secondary | ICD-10-CM

## 2023-10-16 DIAGNOSIS — I4729 Other ventricular tachycardia: Secondary | ICD-10-CM

## 2023-10-16 DIAGNOSIS — I493 Ventricular premature depolarization: Secondary | ICD-10-CM | POA: Diagnosis not present

## 2023-10-16 DIAGNOSIS — R002 Palpitations: Secondary | ICD-10-CM | POA: Diagnosis not present

## 2023-10-16 DIAGNOSIS — R29818 Other symptoms and signs involving the nervous system: Secondary | ICD-10-CM

## 2023-10-16 NOTE — Addendum Note (Signed)
 Addended by: TRUDY MIU R on: 10/16/2023 12:41 PM   Modules accepted: Orders

## 2023-10-16 NOTE — Patient Instructions (Signed)
 Medication Instructions:  Your physician recommends that you continue on your current medications as directed. Please refer to the Current Medication list given to you today.  *If you need a refill on your cardiac medications before your next appointment, please call your pharmacy*  Lab Work: Attached: bmet, cbc, mg, tsh, free t4, t4 If you have labs (blood work) drawn today and your tests are completely normal, you will receive your results only by: MyChart Message (if you have MyChart) OR A paper copy in the mail If you have any lab test that is abnormal or we need to change your treatment, we will call you to review the results.  Testing/Procedures: none  Follow-Up: At [facility], you and your health needs are our priority.  As part of our continuing mission to provide you with exceptional heart care, our providers are all part of one team.  This team includes your primary Cardiologist (physician) and Advanced Practice Providers or APPs (Physician Assistants and Nurse Practitioners) who all work together to provide you with the care you need, when you need it.  Your next appointment:   6 week(s)  Provider:   Harriette Bring or Dr. Loni  We recommend signing up for the patient portal called MyChart.  Sign up information is provided on this After Visit Summary.  MyChart is used to connect with patients for Virtual Visits (Telemedicine).  Patients are able to view lab/test results, encounter notes, upcoming appointments, etc.  Non-urgent messages can be sent to your provider as well.   To learn more about what you can do with MyChart, go to [email or web address].   Other Instructions Referral to Electrophysiology

## 2023-10-17 ENCOUNTER — Ambulatory Visit: Payer: Self-pay | Admitting: Physician Assistant

## 2023-10-17 ENCOUNTER — Telehealth: Payer: Self-pay | Admitting: *Deleted

## 2023-10-17 LAB — T4, FREE: Free T4: 1.86 ng/dL — ABNORMAL HIGH (ref 0.82–1.77)

## 2023-10-17 LAB — BASIC METABOLIC PANEL WITH GFR
BUN/Creatinine Ratio: 19 (ref 12–28)
BUN: 13 mg/dL (ref 8–27)
CO2: 23 mmol/L (ref 20–29)
Calcium: 9.5 mg/dL (ref 8.7–10.3)
Chloride: 102 mmol/L (ref 96–106)
Creatinine, Ser: 0.67 mg/dL (ref 0.57–1.00)
Glucose: 72 mg/dL (ref 70–99)
Potassium: 4.4 mmol/L (ref 3.5–5.2)
Sodium: 142 mmol/L (ref 134–144)
eGFR: 99 mL/min/1.73 (ref >59)

## 2023-10-17 LAB — CBC
Hematocrit: 43.8 % (ref 34.0–46.6)
Hemoglobin: 14.2 g/dL (ref 11.1–15.9)
MCH: 31.3 pg (ref 26.6–33.0)
MCHC: 32.4 g/dL (ref 31.5–35.7)
MCV: 97 fL (ref 79–97)
Platelets: 206 x10E3/uL (ref 150–450)
RBC: 4.54 x10E6/uL (ref 3.77–5.28)
RDW: 12.5 % (ref 11.7–15.4)
WBC: 4.1 x10E3/uL (ref 3.4–10.8)

## 2023-10-17 LAB — MAGNESIUM: Magnesium: 2.3 mg/dL (ref 1.6–2.3)

## 2023-10-17 LAB — TSH: TSH: 1.18 u[IU]/mL (ref 0.450–4.500)

## 2023-10-17 NOTE — Telephone Encounter (Signed)
 Copied from CRM #[id number]. Topic: Clinical - Medication Question >> Oct 17, 2023 11:51 AM Suzen RAMAN wrote: Reason for CRM: patient would like to know if medication levothyroxine  (SYNTHROID ) 100 MCG tablet can be adjusted due to her thyroid  level being elevated; patient was advised she was taking to much per her Cardiologist. Patient would like to know if adjustments can be made prior to her upcoming appt on Monday.  Please contact patient to advised [phone]

## 2023-10-18 NOTE — Telephone Encounter (Signed)
 We could reduce to 88 mcg daily, then we would need to wait 2 months before we could check it again, would she like to reduce?

## 2023-10-21 ENCOUNTER — Other Ambulatory Visit (HOSPITAL_BASED_OUTPATIENT_CLINIC_OR_DEPARTMENT_OTHER): Payer: Self-pay

## 2023-10-21 ENCOUNTER — Ambulatory Visit (INDEPENDENT_AMBULATORY_CARE_PROVIDER_SITE_OTHER): Admitting: Family Medicine

## 2023-10-21 ENCOUNTER — Encounter: Payer: Self-pay | Admitting: Family Medicine

## 2023-10-21 VITALS — BP 120/60 | HR 62 | Temp 98.2°F | Ht 63.0 in | Wt 152.8 lb

## 2023-10-21 DIAGNOSIS — E782 Mixed hyperlipidemia: Secondary | ICD-10-CM

## 2023-10-21 DIAGNOSIS — Z1231 Encounter for screening mammogram for malignant neoplasm of breast: Secondary | ICD-10-CM

## 2023-10-21 DIAGNOSIS — R002 Palpitations: Secondary | ICD-10-CM

## 2023-10-21 DIAGNOSIS — I493 Ventricular premature depolarization: Secondary | ICD-10-CM | POA: Diagnosis not present

## 2023-10-21 DIAGNOSIS — E039 Hypothyroidism, unspecified: Secondary | ICD-10-CM | POA: Diagnosis not present

## 2023-10-21 DIAGNOSIS — Z23 Encounter for immunization: Secondary | ICD-10-CM | POA: Diagnosis not present

## 2023-10-21 MED ORDER — METOPROLOL TARTRATE 25 MG PO TABS
12.5000 mg | ORAL_TABLET | Freq: Two times a day (BID) | ORAL | Status: AC
Start: 2023-10-21 — End: ?

## 2023-10-21 MED ORDER — LEVOTHYROXINE SODIUM 88 MCG PO TABS
88.0000 ug | ORAL_TABLET | Freq: Every day | ORAL | 1 refills | Status: DC
  Filled 2023-10-21: qty 90, 90d supply, fill #0

## 2023-10-21 NOTE — Progress Notes (Signed)
 Established Patient Office Visit  Subjective   Patient ID: Jordan Juarez, female    DOB: 07/09/1960  Age: 63 y.o. MRN: [id number]  Chief Complaint  Patient presents with   Results    Patient requests to discuss thyroid  test results and dose change recommended by cardiologist    HPI Discussed the use of AI scribe software for clinical note transcription with the patient, who gave verbal consent to proceed.  History of Present Illness   Jordan Juarez is a 63 year old female who presents with palpitations for follow-up and medication adjustment.  She experiences frequent palpitations with associated symptoms of feeling faint, dizziness, heaviness, and breathiness. These symptoms are more pronounced in the afternoon and recently caused significant breathlessness at night.  She is on metoprolol 12.5 mg twice daily, but continues to feel palpitations by 8 AM and experiences a run of them in the afternoon. Her blood pressure is typically low, around 100/60, which restricts her ability to increase the metoprolol dosage without experiencing dizziness or syncope.  She has thyroid  issues and has been on thyroid  medication since her twenties. Her TSH is normal at 1.1, but her T4 is slightly elevated. She has been adjusting her thyroid  medication dosage and switched from Synthroid  to a generic version.       Current Outpatient Medications  Medication Instructions   cetirizine (ZYRTEC) 10 mg, Daily PRN   levothyroxine  (SYNTHROID ) 88 mcg, Oral, Daily   metoprolol tartrate (LOPRESSOR) 12.5 mg, Oral, 2 times daily, For palpitations    Patient Active Problem List   Diagnosis Date Noted   Heart palpitations 05/17/2022   Right knee pain 05/18/2019   Right foot pain 03/10/2019   Hyperlipidemia, mixed 08/11/2015   Overweight 08/08/2014   Hypothyroidism 06/03/2012   Allergy 06/03/2012   Preventative health care 06/03/2012   Thyroid  nodule 06/03/2012   Sun-damaged skin 06/03/2012   Anemia  06/03/2012   Breast lesion on mammography 06/03/2012     Review of Systems  All other systems reviewed and are negative.     Objective:     BP 120/60   Pulse 62   Temp 98.2 F (36.8 C) (Oral)   Ht 5' 3 (1.6 m)   Wt 152 lb 12.8 oz (69.3 kg)   LMP 07/05/2010   SpO2 98%   BMI 27.07 kg/m    Physical Exam Vitals reviewed.  Constitutional:      Appearance: Normal appearance. She is well-groomed and normal weight.  Neck:     Thyroid : No thyromegaly.  Cardiovascular:     Rate and Rhythm: Normal rate and regular rhythm.     Heart sounds: Normal heart sounds, S1 normal and S2 normal. No murmur heard. Pulmonary:     Effort: Pulmonary effort is normal.     Breath sounds: Normal breath sounds and air entry.  Musculoskeletal:     Right lower leg: No edema.     Left lower leg: No edema.  Neurological:     Mental Status: She is alert and oriented to person, place, and time. Mental status is at baseline.     Gait: Gait is intact.  Psychiatric:        Mood and Affect: Mood and affect normal.        Speech: Speech normal.        Behavior: Behavior normal.        Judgment: Judgment normal.      No results found for any visits on 10/21/23.    The  10-year ASCVD risk score (Arnett DK, et al., 2019) is: 3.2%    Assessment & Plan:  Immunization due -     Flu vaccine trivalent PF, 6mos and older(Flulaval,Afluria,Fluarix,Fluzone)  Hypothyroidism, unspecified type -     Levothyroxine  Sodium; Take 1 tablet (88 mcg total) by mouth daily.  Dispense: 90 tablet; Refill: 1 -     TSH; Future -     T4, free; Future  Heart palpitations -     Metoprolol Tartrate; Take 0.5 tablets (12.5 mg total) by mouth 2 (two) times daily. For palpitations  Hyperlipidemia, mixed -     Lipid panel; Future   Assessment and Plan    Palpitations Intermittent palpitations with associated breathlessness and heaviness. Metoprolol 12.5 mg twice daily has been initiated, but symptoms persist,  particularly in the afternoon. Blood pressure is typically low, limiting the ability to increase metoprolol dosage significantly. - Continue metoprolol 12.5 mg twice daily - currently is being managed by cardiology who recommended reducing her TSH dose, see below  Thyroid  disorder Thyroid  medication dosage is being adjusted. Current TSH is normal, but T4 is slightly elevated. Previous TSH levels have been low, indicating a need for careful monitoring. The reduction in thyroid  medication may be due to decreased inflammation or changes in medication absorption. - Reduced thyroid  medication to 88 mcg - Ordered TSH and free T4 tests for December - Scheduled lab appointment for TSH and T4 testing  General Health Maintenance Routine health maintenance is up to date. Recent flu shot received. Mammogram is due, and a colonoscopy was completed last year. Lipid panel is due for annual screening. - Ordered mammogram - Scheduled lipid panel for annual screening        No follow-ups on file.    Heron CHRISTELLA Sharper, MD

## 2023-10-23 ENCOUNTER — Telehealth: Payer: Self-pay | Admitting: Student in an Organized Health Care Education/Training Program

## 2023-10-23 NOTE — Telephone Encounter (Signed)
 Spoke to [place] about Jordan Juarez. Sx PVCs, asked if I could see her next week. Will overbook Monday. 12.5% PVC burden. On low dose metoprolol with minimal improvement.  She does not have ECG with any PVCs which started in August of this year.  Last year she had similar episode for around 8 weeks in May 2024 related to stressful event with her husband having surgery.  This time there is no inciting event for her having more of a PVC burden.  We discussed different options for management including antiarrhythmic medication such as flecainide versus ablation.  Would ideally like to see PVCs on rhythm strip which we will coordinate for on Monday.  She will hold metoprolol 2 days prior.  Jordan DELENA Primus, MD [facility]  Cardiac Electrophysiology

## 2023-10-25 ENCOUNTER — Ambulatory Visit
Admission: RE | Admit: 2023-10-25 | Discharge: 2023-10-25 | Disposition: A | Discharge status: Home / Self Care | Source: Ambulatory Visit | Attending: Family Medicine | Admitting: Family Medicine

## 2023-10-25 DIAGNOSIS — Z1231 Encounter for screening mammogram for malignant neoplasm of breast: Secondary | ICD-10-CM | POA: Diagnosis not present

## 2023-10-28 ENCOUNTER — Ambulatory Visit
Attending: Student in an Organized Health Care Education/Training Program | Admitting: Student in an Organized Health Care Education/Training Program

## 2023-10-28 VITALS — BP 108/64 | HR 97 | Resp 70 | Ht 63.0 in | Wt 153.2 lb

## 2023-10-28 DIAGNOSIS — R002 Palpitations: Secondary | ICD-10-CM

## 2023-10-28 DIAGNOSIS — I493 Ventricular premature depolarization: Secondary | ICD-10-CM | POA: Diagnosis not present

## 2023-10-28 NOTE — Patient Instructions (Addendum)
 Medication Instructions:  Your physician recommends that you continue on your current medications as directed. Please refer to the Current Medication list given to you today.  *If you need a refill on your cardiac medications before your next appointment, please call your pharmacy*  Lab Work: None ordered.  You may go to any [facility] Location for your lab work:  [facility] - [address] ([facility]) - [address] [phone] [address]  [place] - [address]   [place]  - [address]   [place] - [address] - [address] [employer]  - [address] [place] - [address] ([facility]   If you have labs (blood work) drawn today and your tests are completely normal, you will receive your results only by: [employer] (if you have MyChart)  If you have any lab test that is abnormal or we need to change your treatment, we will call you or send a MyChart message to review the results.  Testing/Procedures: None ordered.  Follow-Up: At [facility], you and your health needs are our priority.  As part of our continuing mission to provide you with exceptional heart care, we have created designated Provider Care Teams.  These Care Teams include your primary Cardiologist (physician) and Advanced Practice Providers (APPs -  Physician Assistants and Nurse Practitioners) who all work together to provide you with the care you need, when you need it.  Your next appointment:   As needed  The format for your next appointment:   In Person  Provider:   Donnice Primus, MD or one of the following Advanced Practice Providers on your designated Care Team:   Charlies Arthur, [place] Ozell Jodie Passey, [place] Leotis Barrack, NP  Note: Remote monitoring is used to monitor your Pacemaker/ ICD from home. This monitoring reduces the number of office visits required to check  your device to one time per year. It allows us  to keep an eye on the functioning of your device to ensure it is working properly.

## 2023-10-28 NOTE — Progress Notes (Signed)
 Cardiology Office Note   Date:  10/28/2023  ID:  Jordan Juarez, DOB [birth year], MRN [id number] PCP: Ozell Heron HERO, MD  [facility] Providers Cardiologist:  Soyla DELENA Merck, MD Electrophysiologist:  Donnice DELENA Primus, MD   History of Present Illness Jordan Juarez is a 63 y.o. female with symptomatic PVCs who was referred by Dr. Merck for evaluation and management.   Jordan Juarez had similar symptoms last summer that lasted for around 8 weeks however went away on the rhythm.  Jordan Juarez noticed recurrent episodes of palpitations with associated lightheadedness and weakness that started over the past few months.  Jordan Juarez was started on metoprolol tartrate 12.5 mg as needed for symptomatic relief.  Jordan Juarez was offered antiarrhythmic medications versus referral for ablation.    Today Jordan Juarez has minimal symptoms but Jordan Juarez has noticed that her palpitations are somewhat worse off of the metoprolol.  Jordan Juarez has cut out all caffeine including coffee and chocolate.  ROS: palpitations  Studies Reviewed  Zio monitor Result date: 10/09/23-10/16/23 Patch Wear Time:  7 days and 5 hours (2025-10-08T13:15:10-398 to 2025-10-15T18:25:49-0400) Patient had a min HR of 49 bpm, max HR of 182 bpm, and avg HR of 75 bpm. Predominant underlying rhythm was Sinus Rhythm. First Degree AV Block was present. Slight P wave morphology changes were noted. 2 Supraventricular Tachycardia runs occurred, the run  with the fastest interval lasting 12.7 secs with a max rate of 182 bpm (avg 123 bpm); the run with the fastest interval was also the longest. Some episodes of Supraventricular Tachycardia may be possible Atrial Tachycardia with variable block. Ectopic  Atrial Rhythm was present. Supraventricular Tachycardia and Ectopic Atrial Rhythm were detected within +/- 45 seconds of symptomatic patient event(s). Isolated SVEs were rare (<1.0%), SVE Couplets were rare (<1.0%), and no SVE Triplets were present.  Isolated VEs were  frequent (12.5%, 40971), VE Couplets were rare (<1.0%, 5), and no VE Triplets were present. Ventricular Bigeminy and Trigeminy were present.  TTE Result date: 10/04/23 1. Left ventricular ejection fraction, by estimation, is 60 to 65%. The  left ventricle has normal function. The left ventricle has no regional  wall motion abnormalities. Left ventricular diastolic parameters are  consistent with Grade I diastolic  dysfunction (impaired relaxation).   2. Right ventricular systolic function is normal. The right ventricular  size is normal. Tricuspid regurgitation signal is inadequate for assessing  PA pressure.   3. Left atrial size was mildly dilated.   4. The mitral valve is normal in structure. Trivial mitral valve  regurgitation. No evidence of mitral stenosis.   5. The aortic valve is normal in structure. Aortic valve regurgitation is  not visualized. No aortic stenosis is present.   6. The inferior vena cava is dilated in size with >50% respiratory  variability, suggesting right atrial pressure of 8 mmHg.   Physical Exam VS:  BP 108/64   Pulse 97   Resp (!) 70   Ht 5' 3 (1.6 m)   Wt 153 lb 3.2 oz (69.5 kg)   LMP 07/05/2010   BMI 27.14 kg/m       Wt Readings from Last 3 Encounters:  10/28/23 153 lb 3.2 oz (69.5 kg)  10/21/23 152 lb 12.8 oz (69.3 kg)  10/16/23 152 lb (68.9 kg)    GEN: Well nourished, well developed in no acute distress CARDIAC: RRR, no murmurs, rubs, gallops RESPIRATORY:  Clear to auscultation without rales, wheezing or rhonchi  EXTREMITIES:  No edema; No deformity   ASSESSMENT AND PLAN  Jordan Juarez is a 62 y.o. female with symptomatic PVCs who was referred by Dr. Acharya for evaluation and management.   Unifocal PVCs with LVOT/RCC morphology Rhythm strip today with 5 PVCs over 30 seconds with LBBB/inferior axis with V2-V3 acute transition and ratio >0.6 most consistent with RCC origin.  We discussed different options for management including  ablation, observation and antiarrhythmic medication.  Jordan Juarez has no risk factors for CAD and would likely do well on flecainide with a starting PVC burden of 12.5%.  We discussed that LVOT ablation and success rates largely depend on whether the PVC origin is superficial or mid myocardial.  I do think that is most likely RCC origin with the success rate of ablation of 70 to 80% if PVCs are elicited during mapping.  Jordan Juarez is going to talk with her husband about the options and I will call them this week to discuss further.  We reviewed the risk, benefits and alternatives of ablation which include but are not limited to damage to the femoral vein/artery, cardiac perforation requiring drain and/or open heart surgery, damage to the intrinsic conduction system, stroke, dangerous arrhythmias and death.  We also discussed side effects of flecainide mainly related to pre-existing CAD and the need to take concomitant nodal blocking agents to reduce the chances of one-to-one conducted tachycardias.  A total of 45 minutes was spent preparing for the patient, reviewing history, performing exam, document encounter, coordinating care and counseling the patient. 15 minutes was spent with direct patient care.    Dispo: f/u prn pending further discussion with husband   Signed, Donnice DELENA Primus, MD

## 2023-10-30 ENCOUNTER — Ambulatory Visit: Payer: Self-pay | Admitting: Family Medicine

## 2023-11-05 ENCOUNTER — Telehealth: Payer: Self-pay | Admitting: Student in an Organized Health Care Education/Training Program

## 2023-11-05 NOTE — Telephone Encounter (Signed)
 Left VM and return number to see if she had any more thoughts about management of PVCs.

## 2023-11-11 ENCOUNTER — Encounter: Payer: Self-pay | Admitting: Cardiology

## 2023-11-11 DIAGNOSIS — R0683 Snoring: Secondary | ICD-10-CM | POA: Diagnosis not present

## 2023-11-12 ENCOUNTER — Ambulatory Visit: Attending: Internal Medicine

## 2023-11-12 DIAGNOSIS — R0609 Other forms of dyspnea: Secondary | ICD-10-CM

## 2023-11-12 DIAGNOSIS — I493 Ventricular premature depolarization: Secondary | ICD-10-CM

## 2023-11-12 DIAGNOSIS — R0683 Snoring: Secondary | ICD-10-CM

## 2023-11-12 DIAGNOSIS — R002 Palpitations: Secondary | ICD-10-CM

## 2023-11-12 NOTE — Procedures (Signed)
    SLEEP STUDY REPORT Patient Information Study Date: 11/10/2023 Patient Name: Jordan Juarez Patient ID: [id number] Birth Date: [birth year] Age: 63 Gender: Female BMI: 27.0 (W=152 lb, H=5' 3'') Referring Physician: Soyla Merck, MD  TEST DESCRIPTION: Home sleep apnea testing was completed using the WatchPat, a Type 1 device, utilizing peripheral arterial tonometry (PAT), chest movement, actigraphy, pulse oximetry, pulse rate, body position and snore. AHI was calculated with apnea and hypopnea using valid sleep time as the denominator. RDI includes apneas, hypopneas, and RERAs. The data acquired and the scoring of sleep and all associated events were performed in accordance with the recommended standards and specifications as outlined in the AASM Manual for the Scoring of Sleep and Associated Events 2.2.0 (2015).  FINDINGS: 1. No evidence of Obstructive Sleep Apnea with AHI 2/hr. 2. No Central Sleep Apnea. 3. Oxygen desaturations as low as 71%. 4. Moderate to severe snoring was present. O2 sats were < 88% for 0.3 minutes. 5. Total sleep time was 6 hrs and 8 min. 6. 21.7% of total sleep time was spent in REM sleep. 7. Normal sleep onset latency at 28 min. 8. Shortened REM sleep onset latency at 61 min. 9. Total awakenings were 9.  DIAGNOSIS: Normal study with no significant sleep disordered breathing.  RECOMMENDATIONS: 1. Normal study with no significant sleep disordered breathing. 2. Healthy sleep recommendations include: adequate nightly sleep (normal 7-9 hrs/night), avoidance of caffeine after noon and alcohol near bedtime, and maintaining a sleep environment that is cool, dark and quiet. 3. Weight loss for overweight patients is recommended. 4. Snoring recommendations include: weight loss where appropriate, side sleeping, and avoidance of alcohol before bed. 5. Operation of motor vehicle or dangerous equipment must be avoided when feeling drowsy, excessively sleepy,  or mentally fatigued. 6. An ENT consultation which may be useful for specific causes of and possible treatment of bothersome snoring . 7. Weight loss may be of benefit in reducing the severity of snoring.    Signature: Wilbert Bihari, MD; [facility]; Diplomat, American Board of Sleep Medicine Electronically Signed: 11/12/2023 8:02:42 PM

## 2023-11-14 ENCOUNTER — Ambulatory Visit: Admitting: Cardiovascular Disease

## 2023-11-19 ENCOUNTER — Telehealth: Payer: Self-pay | Admitting: *Deleted

## 2023-11-19 NOTE — Telephone Encounter (Signed)
-----   Message from Wilbert Bihari sent at 11/12/2023  8:04 PM EST ----- Please let patient know that sleep study showed no significant sleep apnea.

## 2023-11-26 ENCOUNTER — Ambulatory Visit: Attending: Internal Medicine | Admitting: Internal Medicine

## 2023-11-26 ENCOUNTER — Encounter: Payer: Self-pay | Admitting: Internal Medicine

## 2023-11-26 VITALS — BP 119/72 | HR 64 | Ht 63.0 in | Wt 153.6 lb

## 2023-11-26 DIAGNOSIS — I493 Ventricular premature depolarization: Secondary | ICD-10-CM

## 2023-11-26 NOTE — Progress Notes (Signed)
 Cardiology Office Note:  .   Date:  11/26/2023  ID:  Jordan Juarez, DOB 1960/11/11, MRN [id number] PCP: Ozell Heron HERO, MD  [facility] Providers Cardiologist:  Soyla DELENA Merck, MD Electrophysiologist:  Donnice DELENA Primus, MD    History of Present Illness: .   Jordan Juarez is a 63 y.o. female.  Discussed the use of AI scribe software for clinical note transcription with the patient, who gave verbal consent to proceed.  History of Present Illness Jordan Juarez is a 63 year old female who presents with palpitations for evaluation of symptomatic PVCs.   She describes daily palpitations with a racing heart, with rates ranging from 40 to 100 beats per minute. Episodes occur mainly in the afternoon around 1:15 PM and sometimes after waking at night to use the bathroom, but they do not wake her from sleep.  She previously took metoprolol , which reduced symptoms for a few hours but did not last throughout the day, so she stopped it a few days before this visit to better assess her baseline symptoms. She feels sluggish and low energy, which she attributes to either metoprolol  or the palpitations.She has seen EP who considered PVC ablation.  She wore a 3-day heart monitor that recorded 49,971 PVCs, averaging about 13,000 PVCs per day.  She takes Synthroid  daily and is planned for thyroid  and cholesterol testing. She has no known coronary artery disease. Coronary CTA was normal, and a prior positive stress test was considered a false positive in this context.    ROS: negative except per HPI above.  Studies Reviewed: SABRA   EKG Interpretation Date/Time:  Tuesday November 26 2023 08:59:05 EST Ventricular Rate:  64 PR Interval:  162 QRS Duration:  82 QT Interval:  438 QTC Calculation: 451 R Axis:   64  Text Interpretation: Normal sinus rhythm Normal ECG When compared with ECG of 04-Oct-2023 11:22, No significant change was found Confirmed by Merck Soyla ([id number]) on  11/26/2023 9:02:12 AM    Results LABS LDL: Elevated  RADIOLOGY Coronary CTA: Normal (May 2025)  DIAGNOSTIC EKG: Normal (11/26/2023) PVC count: 13,000 per day Treadmill stress test: Positive for ischemia, likely false positive due to normal coronary CTA (May 2025) Risk Assessment/Calculations:       Physical Exam:   VS:  BP 119/72 (BP Location: Left Arm, Patient Position: Sitting)   Pulse 64   Ht 5' 3 (1.6 m)   Wt 153 lb 9.6 oz (69.7 kg)   LMP 07/05/2010   SpO2 97%   BMI 27.21 kg/m    Wt Readings from Last 3 Encounters:  11/26/23 153 lb 9.6 oz (69.7 kg)  10/28/23 153 lb 3.2 oz (69.5 kg)  10/21/23 152 lb 12.8 oz (69.3 kg)     Physical Exam GENERAL: Alert, cooperative, well developed, no acute distress HEENT: Normocephalic, normal oropharynx, moist mucous membranes CHEST: Clear to auscultation bilaterally, No wheezes, rhonchi, or crackles CARDIOVASCULAR: Normal heart rate and rhythm, S1 and S2 normal without murmurs, Heart auscultation normal ABDOMEN: Soft, non-tender, non-distended, without organomegaly, Normal bowel sounds EXTREMITIES: No cyanosis or edema NEUROLOGICAL: Cranial nerves grossly intact, Moves all extremities without gross motor or sensory deficit   ASSESSMENT AND PLAN: .    Assessment and Plan Assessment & Plan Ventricular premature complexes (PVCs) Shortness of breath Persistent PVCs with daily symptoms. Previous metoprolol  use provided temporary relief. Considering ablation due to anatomical complexity and potential ineffectiveness of medication. Discussed risks and benefits of ablation versus medication. Concerns about long-term antiarrhythmic use. Discussed  referral to [facility] for potential ablation with Dr. Denzil. - Consider metoprolol  12.5 mg three times daily for symptomatic relief. - Consider diltiazem  120 mg daily as an alternative to metoprolol  to avoid beta blockade effect. - Ensure adequate hydration and electrolyte intake if on  metoprolol .  Elevated LDL cholesterol LDL cholesterol slightly elevated. No coronary artery disease. No urgency for medication. Plan to focus on dietary modifications post-holidays. Consideration of lipoprotein A testing for risk stratification. - Check lipoprotein A with next lab work. - Focus on dietary modifications  Snoring - no OSA on sleep testing         Soyla Merck, MD, FACC

## 2023-11-26 NOTE — Patient Instructions (Signed)
 Medication Instructions:  No Changes *If you need a refill on your cardiac medications before your next appointment, please call your pharmacy*  Lab Work: Please have your Primary Care Provider check a LPa (Lipoprotein A) with your next lab draw. Thank you. If needed, they can fax the results to: [phone]  Attn: Dr. Soyla Merck  Follow-Up: At [facility], you and your health needs are our priority.  As part of our continuing mission to provide you with exceptional heart care, our providers are all part of one team.  This team includes your primary Cardiologist (physician) and Advanced Practice Providers or APPs (Physician Assistants and Nurse Practitioners) who all work together to provide you with the care you need, when you need it.  Your next appointment:   3 month(s)  Provider:   Gayatri A Acharya, MD   Other Instructions Please have your Primary Care Provider check a LPa (Lipoprotein A) with your next lab draw. Thank you. If needed, they can fax the results to: [phone]

## 2023-12-03 ENCOUNTER — Other Ambulatory Visit: Payer: Self-pay | Admitting: Internal Medicine

## 2023-12-03 ENCOUNTER — Other Ambulatory Visit (HOSPITAL_BASED_OUTPATIENT_CLINIC_OR_DEPARTMENT_OTHER): Payer: Self-pay

## 2023-12-03 MED ORDER — DILTIAZEM HCL ER 120 MG PO CP12
120.0000 mg | ORAL_CAPSULE | Freq: Every day | ORAL | 1 refills | Status: AC
  Filled 2023-12-03: qty 30, 30d supply, fill #0

## 2023-12-04 ENCOUNTER — Other Ambulatory Visit (HOSPITAL_BASED_OUTPATIENT_CLINIC_OR_DEPARTMENT_OTHER): Payer: Self-pay

## 2023-12-04 ENCOUNTER — Other Ambulatory Visit: Payer: Self-pay | Admitting: Internal Medicine

## 2023-12-12 ENCOUNTER — Telehealth: Payer: Self-pay

## 2023-12-12 NOTE — Telephone Encounter (Unsigned)
 Copied from CRM #[id number]. Topic: General - Call Back - No Documentation >> Oct 18, 2023  4:56 PM Jordan Juarez wrote: Reason for CRM: pt returning call from [name] . Relayed message . Pt said that she would like to reduce to 88 mcg daily but would like to keep Monday's appt to further discuss with pcp.

## 2023-12-18 ENCOUNTER — Other Ambulatory Visit

## 2023-12-18 ENCOUNTER — Other Ambulatory Visit: Payer: Self-pay

## 2023-12-18 ENCOUNTER — Other Ambulatory Visit (HOSPITAL_BASED_OUTPATIENT_CLINIC_OR_DEPARTMENT_OTHER): Payer: Self-pay

## 2023-12-18 DIAGNOSIS — E039 Hypothyroidism, unspecified: Secondary | ICD-10-CM

## 2023-12-18 DIAGNOSIS — E782 Mixed hyperlipidemia: Secondary | ICD-10-CM | POA: Diagnosis not present

## 2023-12-18 LAB — T4, FREE: Free T4: 1.05 ng/dL (ref 0.60–1.60)

## 2023-12-18 LAB — LIPID PANEL
Cholesterol: 197 mg/dL (ref 28–200)
HDL: 73.5 mg/dL (ref >39.00)
LDL Cholesterol: 114 mg/dL — ABNORMAL HIGH (ref 10–99)
NonHDL: 123.33
Total CHOL/HDL Ratio: 3
Triglycerides: 46 mg/dL (ref 10.0–149.0)
VLDL: 9.2 mg/dL (ref 0.0–40.0)

## 2023-12-18 LAB — TSH: TSH: 19.07 u[IU]/mL — ABNORMAL HIGH (ref 0.35–5.50)

## 2023-12-18 MED ORDER — LEVOTHYROXINE SODIUM 100 MCG PO TABS
100.0000 ug | ORAL_TABLET | Freq: Every day | ORAL | 3 refills | Status: AC
  Filled 2023-12-18 (×2): qty 90, 90d supply, fill #0

## 2024-02-13 ENCOUNTER — Ambulatory Visit: Admitting: Internal Medicine
